# Patient Record
Sex: Female | Born: 1955 | Hispanic: No | Marital: Married | Smoking: Never smoker
Health system: Southern US, Community
[De-identification: ages and names within clinical notes are randomized; demographics above are authoritative.]

## PROBLEM LIST (undated history)

## (undated) DIAGNOSIS — C55 Malignant neoplasm of uterus, part unspecified: Secondary | ICD-10-CM

## (undated) DIAGNOSIS — Z8619 Personal history of other infectious and parasitic diseases: Secondary | ICD-10-CM

## (undated) DIAGNOSIS — N390 Urinary tract infection, site not specified: Secondary | ICD-10-CM

## (undated) DIAGNOSIS — E785 Hyperlipidemia, unspecified: Secondary | ICD-10-CM

## (undated) DIAGNOSIS — E7800 Pure hypercholesterolemia, unspecified: Secondary | ICD-10-CM

## (undated) HISTORY — DX: Urinary tract infection, site not specified: N39.0

## (undated) HISTORY — DX: Malignant neoplasm of uterus, part unspecified: C55

## (undated) HISTORY — PX: ABDOMINAL HYSTERECTOMY: SHX81

## (undated) HISTORY — DX: Personal history of other infectious and parasitic diseases: Z86.19

## (undated) HISTORY — DX: Pure hypercholesterolemia, unspecified: E78.00

## (undated) HISTORY — PX: APPENDECTOMY: SHX54

## (undated) HISTORY — DX: Hyperlipidemia, unspecified: E78.5

---

## 2016-08-17 ENCOUNTER — Telehealth: Payer: Self-pay | Admitting: Cardiology

## 2016-08-17 NOTE — Telephone Encounter (Signed)
08/17/2016 Received faxed referral packet from [facility] for upcoming with Dr. Stanford Breed on 09/02/2016. Records given to [facility]. cbr

## 2016-08-18 ENCOUNTER — Other Ambulatory Visit: Payer: Self-pay | Admitting: Physician Assistant

## 2016-08-18 DIAGNOSIS — R9431 Abnormal electrocardiogram [ECG] [EKG]: Secondary | ICD-10-CM

## 2016-08-25 NOTE — Progress Notes (Signed)
     HPI: 61 yo female for evaluation of abnormal ECG. Echocardiogram January 2018 showed normal LV function, grade 2 diastolic dysfunction and mild left atrial enlargement. Patient recently seen by primary care and felt to have an abnormal electrocardiogram. Cardiology asked to evaluate. She has dyspnea with vigorous activities but not routine activities. She denies orthopnea, PND, pedal edema, exertional chest pain or syncope. Occasional brief skip but no sustained palpitations.  Current Outpatient Prescriptions  Medication Sig Dispense Refill  . Multiple Vitamin (THERA) TABS Take 1 tablet by mouth daily.    . rosuvastatin (CRESTOR) 10 MG tablet Take 10 mg by mouth at bedtime.  0   No current facility-administered medications for this visit.     Not on File   Past Medical History:  Diagnosis Date  . Hyperlipidemia   . Uterine cancer [facility])     Past Surgical History:  Procedure Laterality Date  . ABDOMINAL HYSTERECTOMY    . APPENDECTOMY      Social History   Social History  . Marital status: Married    Spouse name: N/A  . Number of children: 2  . Years of education: N/A   Occupational History  . Not on file.   Social History Main Topics  . Smoking status: Never Smoker  . Smokeless tobacco: Never Used  . Alcohol use Yes     Comment: Rare  . Drug use: Unknown  . Sexual activity: Not on file   Other Topics Concern  . Not on file   Social History Narrative  . No narrative on file    Family History  Problem Relation Age of Onset  . Diabetes Mother   . Parkinson's disease Mother   . Kidney failure Father   . CAD Brother     ROS: no fevers or chills, productive cough, hemoptysis, dysphasia, odynophagia, melena, hematochezia, dysuria, hematuria, rash, seizure activity, orthopnea, PND, pedal edema, claudication. Remaining systems are negative.  Physical Exam:   Blood pressure 136/73, pulse 80, height 5\' 2"  (1.575 m), weight 214 lb 9.6 oz (97.3 kg).  General:   Well developed/obese in NAD Skin warm/dry Patient not depressed No peripheral clubbing Back-normal HEENT-normal/normal eyelids Neck supple/normal carotid upstroke bilaterally; no bruits; no JVD; no thyromegaly chest - CTA/ normal expansion CV - RRR/normal S1 and S2; no murmurs, rubs or gallops;  PMI nondisplaced Abdomen -NT/ND, no HSM, no mass, + bowel sounds, no bruit 2+ femoral pulses, no bruits Ext-no edema, chords, 2+ DP Neuro-grossly nonfocal  ECG -Sinus rhythm at a rate of 80. Nonspecific anterior T-wave changes.  A/P  1 Abnormal ECG-patient has nonspecific ST changes. She does have some dyspnea on exertion. Echocardiogram shows normal LV function. I will arrange an exercise treadmill for risk stratification given risk factors including cholesterol of 300, family history of coronary disease.   2 hyperlipidemia-continue statin. Lipids and liver monitored by primary care.  3 obesity-patient counseled on exercise and weight loss.     Kirk Ruths, MD

## 2016-08-27 ENCOUNTER — Other Ambulatory Visit: Payer: Self-pay | Admitting: Physician Assistant

## 2016-08-27 DIAGNOSIS — Z1231 Encounter for screening mammogram for malignant neoplasm of breast: Secondary | ICD-10-CM

## 2016-09-01 ENCOUNTER — Encounter (INDEPENDENT_AMBULATORY_CARE_PROVIDER_SITE_OTHER): Payer: Self-pay

## 2016-09-01 ENCOUNTER — Ambulatory Visit (HOSPITAL_COMMUNITY): Payer: BLUE CROSS/BLUE SHIELD | Attending: Cardiology

## 2016-09-01 ENCOUNTER — Other Ambulatory Visit: Payer: Self-pay

## 2016-09-01 DIAGNOSIS — R9431 Abnormal electrocardiogram [ECG] [EKG]: Secondary | ICD-10-CM

## 2016-09-01 DIAGNOSIS — I501 Left ventricular failure: Secondary | ICD-10-CM | POA: Insufficient documentation

## 2016-09-02 ENCOUNTER — Encounter: Payer: Self-pay | Admitting: Cardiology

## 2016-09-02 ENCOUNTER — Ambulatory Visit (INDEPENDENT_AMBULATORY_CARE_PROVIDER_SITE_OTHER): Payer: BLUE CROSS/BLUE SHIELD | Admitting: Cardiology

## 2016-09-02 VITALS — BP 136/73 | HR 80 | Ht 62.0 in | Wt 214.6 lb

## 2016-09-02 DIAGNOSIS — R9431 Abnormal electrocardiogram [ECG] [EKG]: Secondary | ICD-10-CM | POA: Diagnosis not present

## 2016-09-02 DIAGNOSIS — E7800 Pure hypercholesterolemia, unspecified: Secondary | ICD-10-CM | POA: Diagnosis not present

## 2016-09-02 NOTE — Patient Instructions (Signed)
Medication Instructions:   NO CHANGE  Testing/Procedures:  Your physician has requested that you have an exercise tolerance test. For further information please visit [email or web address]. Please also follow instruction sheet, as given.    Follow-Up:  Your physician recommends that you schedule a follow-up appointment in: AS NEEDED PENDING TEST RESULTS    Exercise Stress Electrocardiogram An exercise stress electrocardiogram is a test to check how blood flows to your heart. It is done to find areas of poor blood flow. You will need to walk on a treadmill for this test. The electrocardiogram will record your heartbeat when you are at rest and when you are exercising. What happens before the procedure?  Do not have drinks with caffeine or foods with caffeine for 24 hours before the test, or as told by your doctor. This includes coffee, tea (even decaf tea), sodas, chocolate, and cocoa.  Follow your doctor's instructions about eating and drinking before the test.  Ask your doctor what medicines you should or should not take before the test. Take your medicines with water unless told by your doctor not to.  If you use an inhaler, bring it with you to the test.  Bring a snack to eat after the test.  Do not  smoke for 4 hours before the test.  Do not put lotions, powders, creams, or oils on your chest before the test.  Wear comfortable shoes and clothing. What happens during the procedure?  You will have patches put on your chest. Small areas of your chest may need to be shaved. Wires will be connected to the patches.  Your heart rate will be watched while you are resting and while you are exercising.  You will walk on the treadmill. The treadmill will slowly get faster to raise your heart rate.  The test will take about 1-2 hours. What happens after the procedure?  Your heart rate and blood pressure will be watched after the test.  You may return to your normal diet,  activities, and medicines or as told by your doctor. This information is not intended to replace advice given to you by your health care provider. Make sure you discuss any questions you have with your health care provider. Document Released: 01/12/2008 Document Revised: 03/24/2016 Document Reviewed: 04/02/2013 Elsevier Interactive Patient Education  2017 Elsevier Inc.    

## 2016-09-04 ENCOUNTER — Encounter: Payer: Self-pay | Admitting: Cardiology

## 2016-09-17 ENCOUNTER — Telehealth (HOSPITAL_COMMUNITY): Payer: Self-pay

## 2016-09-17 NOTE — Telephone Encounter (Signed)
Encounter complete. 

## 2016-09-22 ENCOUNTER — Ambulatory Visit (HOSPITAL_COMMUNITY)
Admission: RE | Admit: 2016-09-22 | Discharge: 2016-09-22 | Disposition: A | Discharge status: Home / Self Care | Payer: BLUE CROSS/BLUE SHIELD | Source: Ambulatory Visit | Attending: Cardiology | Admitting: Cardiology

## 2016-09-22 DIAGNOSIS — R9431 Abnormal electrocardiogram [ECG] [EKG]: Secondary | ICD-10-CM | POA: Diagnosis not present

## 2016-09-22 LAB — EXERCISE TOLERANCE TEST
CHL RATE OF PERCEIVED EXERTION: 17
CSEPED: 7 min
CSEPEW: 9.3 METS
CSEPPHR: 144 {beats}/min
Exercise duration (sec): 32 s
MPHR: 159 {beats}/min
Percent HR: 90 %
Rest HR: 80 {beats}/min

## 2016-09-23 ENCOUNTER — Ambulatory Visit
Admission: RE | Admit: 2016-09-23 | Discharge: 2016-09-23 | Disposition: A | Discharge status: Home / Self Care | Payer: BLUE CROSS/BLUE SHIELD | Source: Ambulatory Visit | Attending: Physician Assistant | Admitting: Physician Assistant

## 2016-09-23 DIAGNOSIS — Z1231 Encounter for screening mammogram for malignant neoplasm of breast: Secondary | ICD-10-CM

## 2016-10-18 ENCOUNTER — Other Ambulatory Visit (HOSPITAL_COMMUNITY): Payer: Self-pay | Admitting: Physician Assistant

## 2016-10-18 DIAGNOSIS — R0989 Other specified symptoms and signs involving the circulatory and respiratory systems: Secondary | ICD-10-CM

## 2016-10-19 ENCOUNTER — Ambulatory Visit (HOSPITAL_COMMUNITY)
Admission: RE | Admit: 2016-10-19 | Discharge: 2016-10-19 | Disposition: A | Discharge status: Home / Self Care | Payer: BLUE CROSS/BLUE SHIELD | Source: Ambulatory Visit | Attending: Cardiovascular Disease | Admitting: Cardiovascular Disease

## 2016-10-19 DIAGNOSIS — R0989 Other specified symptoms and signs involving the circulatory and respiratory systems: Secondary | ICD-10-CM

## 2016-10-19 DIAGNOSIS — I6523 Occlusion and stenosis of bilateral carotid arteries: Secondary | ICD-10-CM | POA: Insufficient documentation

## 2016-11-06 ENCOUNTER — Encounter: Payer: Self-pay | Admitting: Cardiology

## 2016-11-16 ENCOUNTER — Encounter: Payer: Self-pay | Admitting: Cardiology

## 2016-11-17 ENCOUNTER — Encounter: Payer: Self-pay | Admitting: *Deleted

## 2018-02-13 DIAGNOSIS — L728 Other follicular cysts of the skin and subcutaneous tissue: Secondary | ICD-10-CM | POA: Diagnosis not present

## 2018-02-13 DIAGNOSIS — D2339 Other benign neoplasm of skin of other parts of face: Secondary | ICD-10-CM | POA: Diagnosis not present

## 2018-05-15 DIAGNOSIS — M8580 Other specified disorders of bone density and structure, unspecified site: Secondary | ICD-10-CM | POA: Diagnosis not present

## 2018-05-15 DIAGNOSIS — Z1211 Encounter for screening for malignant neoplasm of colon: Secondary | ICD-10-CM | POA: Diagnosis not present

## 2018-05-15 DIAGNOSIS — Z7689 Persons encountering health services in other specified circumstances: Secondary | ICD-10-CM | POA: Diagnosis not present

## 2018-05-15 DIAGNOSIS — Z23 Encounter for immunization: Secondary | ICD-10-CM | POA: Diagnosis not present

## 2018-05-15 DIAGNOSIS — Z0000 Encounter for general adult medical examination without abnormal findings: Secondary | ICD-10-CM | POA: Diagnosis not present

## 2018-05-19 DIAGNOSIS — M8589 Other specified disorders of bone density and structure, multiple sites: Secondary | ICD-10-CM | POA: Insufficient documentation

## 2018-05-19 DIAGNOSIS — E7800 Pure hypercholesterolemia, unspecified: Secondary | ICD-10-CM | POA: Insufficient documentation

## 2018-10-13 DIAGNOSIS — M8580 Other specified disorders of bone density and structure, unspecified site: Secondary | ICD-10-CM | POA: Diagnosis not present

## 2019-06-04 ENCOUNTER — Ambulatory Visit: Payer: BLUE CROSS/BLUE SHIELD | Admitting: Family Medicine

## 2019-07-18 NOTE — Progress Notes (Signed)
HPI: FU abnormal ECG.  Patient not seen since January 2018. Echocardiogram January 2018 showed normal LV function, grade 2 diastolic dysfunction and mild left atrial enlargement.  Exercise treadmill February 2018 negative. Carotid Dopplers March 2018 showed 1 to 39% bilateral stenosis.  Patient denies dyspnea on exertion, orthopnea, PND, pedal edema, syncope or exertional chest pain.  She has had problems with occasional palpitations.  These were worse when she was taking Crestor by her report.  She has been predominantly at night and described as her heart racing.  The gradually resolved.  Current Outpatient Medications  Medication Sig Dispense Refill  . aspirin 81 MG tablet Take 81 mg by mouth daily.    . Cholecalciferol (VITAMIN D PO) Take 1 tablet by mouth daily.    . Multiple Vitamin (THERA) TABS Take 1 tablet by mouth daily.    Marland Kitchen antiseptic oral rinse (BIOTENE) LIQD 15 mLs by Mouth Rinse route daily.    . rosuvastatin (CRESTOR) 10 MG tablet Take 10 mg by mouth at bedtime.  0   No current facility-administered medications for this visit.     Past Medical History:  Diagnosis Date  . Hyperlipidemia   . Uterine cancer [facility])     Past Surgical History:  Procedure Laterality Date  . ABDOMINAL HYSTERECTOMY    . APPENDECTOMY      Social History   Socioeconomic History  . Marital status: Married    Spouse name: Not on file  . Number of children: 2  . Years of education: Not on file  . Highest education level: Not on file  Occupational History  . Not on file  Tobacco Use  . Smoking status: Never Smoker  . Smokeless tobacco: Never Used  Substance and Sexual Activity  . Alcohol use: Yes    Comment: Rare  . Drug use: Not on file  . Sexual activity: Not on file  Other Topics Concern  . Not on file  Social History Narrative  . Not on file   Social Determinants of Health   Financial Resource Strain:   . Difficulty of Paying Living Expenses: Not on file  Food  Insecurity:   . Worried About Charity fundraiser in the Last Year: Not on file  . Ran Out of Food in the Last Year: Not on file  Transportation Needs:   . Lack of Transportation (Medical): Not on file  . Lack of Transportation (Non-Medical): Not on file  Physical Activity:   . Days of Exercise per Week: Not on file  . Minutes of Exercise per Session: Not on file  Stress:   . Feeling of Stress : Not on file  Social Connections:   . Frequency of Communication with Friends and Family: Not on file  . Frequency of Social Gatherings with Friends and Family: Not on file  . Attends Religious Services: Not on file  . Active Member of Clubs or Organizations: Not on file  . Attends Archivist Meetings: Not on file  . Marital Status: Not on file  Intimate Partner Violence:   . Fear of Current or Ex-Partner: Not on file  . Emotionally Abused: Not on file  . Physically Abused: Not on file  . Sexually Abused: Not on file    Family History  Problem Relation Age of Onset  . Diabetes Mother   . Parkinson's disease Mother   . Kidney failure Father   . CAD Brother     ROS: no fevers or  chills, productive cough, hemoptysis, dysphasia, odynophagia, melena, hematochezia, dysuria, hematuria, rash, seizure activity, orthopnea, PND, pedal edema, claudication. Remaining systems are negative.  Physical Exam: Well-developed well-nourished in no acute distress.  Skin is warm and dry.  HEENT is normal.  Neck is supple.  Chest is clear to auscultation with normal expansion.  Cardiovascular exam is regular rate and rhythm.  Abdominal exam nontender or distended. No masses palpated. Extremities show no edema. neuro grossly intact  ECG-sinus rhythm at a rate of 79, no ST changes.  Personally reviewed  A/P  1 abnormal electrocardiogram-previous electrocardiogram showed nonspecific changes.  Echocardiogram showed normal LV function and exercise treadmill was normal.  No symptoms at present.   No plans for further evaluation.  2 hyperlipidemia-she discontinued her statin as it was potentially contributing to palpitations by her report.  She is scheduled to see primary care later this month and have blood work done.  She may need resumption of statin pending those results.  3 obesity-needs diet, exercise and weight loss.  4 palpitations-etiology unclear.  We have given the patient the name of alivecor.  She will record strips of her rhythm when she has palpitations and we will review.  5 elevated blood pressure reading-she does not carry a diagnosis of hypertension.  I have asked her to track this at home and we will add medications if needed.  Kirk Ruths, MD

## 2019-07-23 ENCOUNTER — Encounter: Payer: Self-pay | Admitting: Cardiology

## 2019-07-23 ENCOUNTER — Ambulatory Visit (INDEPENDENT_AMBULATORY_CARE_PROVIDER_SITE_OTHER): Payer: BLUE CROSS/BLUE SHIELD | Admitting: Cardiology

## 2019-07-23 ENCOUNTER — Other Ambulatory Visit: Payer: Self-pay

## 2019-07-23 VITALS — BP 148/91 | HR 78 | Temp 96.0°F | Ht 61.5 in | Wt 220.0 lb

## 2019-07-23 DIAGNOSIS — R9431 Abnormal electrocardiogram [ECG] [EKG]: Secondary | ICD-10-CM | POA: Diagnosis not present

## 2019-07-23 DIAGNOSIS — R002 Palpitations: Secondary | ICD-10-CM

## 2019-07-23 DIAGNOSIS — R030 Elevated blood-pressure reading, without diagnosis of hypertension: Secondary | ICD-10-CM | POA: Diagnosis not present

## 2019-07-23 NOTE — Patient Instructions (Signed)

## 2019-07-31 ENCOUNTER — Other Ambulatory Visit: Payer: Self-pay

## 2019-08-01 ENCOUNTER — Ambulatory Visit (INDEPENDENT_AMBULATORY_CARE_PROVIDER_SITE_OTHER): Payer: BLUE CROSS/BLUE SHIELD | Admitting: Family Medicine

## 2019-08-01 ENCOUNTER — Encounter: Payer: Self-pay | Admitting: Family Medicine

## 2019-08-01 VITALS — BP 116/82 | HR 84 | Temp 97.4°F | Ht 61.5 in | Wt 218.0 lb

## 2019-08-01 DIAGNOSIS — C55 Malignant neoplasm of uterus, part unspecified: Secondary | ICD-10-CM | POA: Insufficient documentation

## 2019-08-01 DIAGNOSIS — Z0000 Encounter for general adult medical examination without abnormal findings: Secondary | ICD-10-CM | POA: Diagnosis not present

## 2019-08-01 DIAGNOSIS — Z1159 Encounter for screening for other viral diseases: Secondary | ICD-10-CM

## 2019-08-01 DIAGNOSIS — E7800 Pure hypercholesterolemia, unspecified: Secondary | ICD-10-CM | POA: Diagnosis not present

## 2019-08-01 DIAGNOSIS — M8589 Other specified disorders of bone density and structure, multiple sites: Secondary | ICD-10-CM | POA: Diagnosis not present

## 2019-08-01 DIAGNOSIS — Z1211 Encounter for screening for malignant neoplasm of colon: Secondary | ICD-10-CM

## 2019-08-01 DIAGNOSIS — Z114 Encounter for screening for human immunodeficiency virus [HIV]: Secondary | ICD-10-CM

## 2019-08-01 NOTE — Progress Notes (Signed)
Patient: Suzanne Kelley MRN: [id number] DOB: 05-02-1956 PCP: Orma Flaming, MD     Subjective:  Chief Complaint  Patient presents with  . Establish Care  . Annual Exam    HPI: The patient is a 63 y.o. female who presents today for annual exam. She denies any changes to past medical history. There have been no recent hospitalizations. They are following a well balanced diet and exercise plan. Weight has been stable. No complaints today. She is fasting. Also recently seen by dermatology for skin exam with no abnormal findings.   No family history of colon cancer or breast cancer. Dad died from renal failure in his 7's. Her mother was in a NH. She didn't take care of herself. Unsure of history of CAD/DM/HTN. ? PD in her mother.   Hyperlipidemia: she has seen a cardiologist in the past and was on crestor. She had some palpitations that the cardiologist worked up and said was "fine." She has not filled the crestor again.   Osteopenia: diagnosed 2 years. On calcium and vitamin D. Will need repeat of her DEXA in 3-5 years.   Immunization History  Administered Date(s) Administered  . Influenza-Unspecified 05/15/2018  . Tdap 10/11/2016   Colonoscopy: due for this.  Mammogram: 2018, wnl.  Pap smear: s/p hysterectomy for uterine cancer. No longer needs pap smears.  Flu shot: declines.    Review of Systems  Constitutional: Negative for chills, fatigue and fever.  HENT: Negative for dental problem, ear pain, hearing loss and trouble swallowing.   Eyes: Negative for visual disturbance.  Respiratory: Negative for cough, chest tightness and shortness of breath.   Cardiovascular: Negative for chest pain, palpitations and leg swelling.  Gastrointestinal: Negative for abdominal pain, blood in stool, diarrhea and nausea.  Endocrine: Negative for cold intolerance, heat intolerance, polydipsia, polyphagia and polyuria.  Genitourinary: Negative for dysuria and hematuria.  Musculoskeletal:  Negative for arthralgias.  Skin: Negative for rash.  Neurological: Negative for dizziness and headaches.  Psychiatric/Behavioral: Positive for sleep disturbance. Negative for dysphoric mood. The patient is not nervous/anxious.     Allergies Patient has No Known Allergies.  Past Medical History Patient  has a past medical history of Hyperlipidemia and Uterine cancer ([place]).  Surgical History Patient  has a past surgical history that includes Abdominal hysterectomy and Appendectomy.  Family History Pateint's family history includes CAD in her brother; Diabetes in her mother; Kidney failure in her father; Parkinson's disease in her mother.  Social History Patient  reports that she has never smoked. She has never used smokeless tobacco. She reports current alcohol use.    Objective: Vitals:   08/01/19 0828  BP: 116/82  Pulse: 84  Temp: (!) 97.4 F (36.3 C)  TempSrc: Skin  SpO2: 98%  Weight: 218 lb (98.9 kg)  Height: 5' 1.5" (1.562 m)    Body mass index is 40.52 kg/m.  Physical Exam Vitals reviewed.  Constitutional:      Appearance: Normal appearance. She is well-developed. She is obese.  HENT:     Head: Normocephalic and atraumatic.     Right Ear: Tympanic membrane, ear canal and external ear normal.     Left Ear: Tympanic membrane, ear canal and external ear normal.     Nose: Nose normal.     Mouth/Throat:     Mouth: Mucous membranes are moist.  Eyes:     Extraocular Movements: Extraocular movements intact.     Conjunctiva/sclera: Conjunctivae normal.     Pupils: Pupils are equal, round,  and reactive to light.  Neck:     Thyroid: No thyromegaly.  Cardiovascular:     Rate and Rhythm: Normal rate and regular rhythm.     Pulses: Normal pulses.     Heart sounds: Normal heart sounds. No murmur.  Pulmonary:     Effort: Pulmonary effort is normal.     Breath sounds: Normal breath sounds.  Abdominal:     General: Bowel sounds are normal. There is no distension.      Palpations: Abdomen is soft.     Tenderness: There is no abdominal tenderness.  Musculoskeletal:     Cervical back: Normal range of motion and neck supple.  Lymphadenopathy:     Cervical: No cervical adenopathy.  Skin:    General: Skin is warm and dry.     Findings: No rash.  Neurological:     General: No focal deficit present.     Mental Status: She is alert and oriented to person, place, and time.     Cranial Nerves: No cranial nerve deficit.     Coordination: Coordination normal.     Deep Tendon Reflexes: Reflexes normal.  Psychiatric:        Mood and Affect: Mood normal.        Behavior: Behavior normal.        Depression screen [facility] 2/9 08/01/2019  Decreased Interest 0  Down, Depressed, Hopeless 0  PHQ - 2 Score 0    Assessment/plan: 1. Annual physical exam Routine fasting labs today. addressed all of her HM. Declines flu shot, encouraged her to get this. cologuard ordered, information on mmg given. Hepc/hiv routine screening labs ordered. utd on tetanus. Encouraged exercise and weight loss. Followed by derm for skin checks.  F/u in one year or as needed.  Patient counseling [x]    Nutrition: Stressed importance of moderation in sodium/caffeine intake, saturated fat and cholesterol, caloric balance, sufficient intake of fresh fruits, vegetables, fiber, calcium, iron, and 1 mg of folate supplement per day (for females capable of pregnancy).  [x]    Stressed the importance of regular exercise.   []    Substance Abuse: Discussed cessation/primary prevention of tobacco, alcohol, or other drug use; driving or other dangerous activities under the influence; availability of treatment for abuse.   [x]    Injury prevention: Discussed safety belts, safety helmets, smoke detector, smoking near bedding or upholstery.   [x]    Sexuality: Discussed sexually transmitted diseases, partner selection, use of condoms, avoidance of unintended pregnancy  and contraceptive alternatives.  [x]    Dental  health: Discussed importance of regular tooth brushing, flossing, and dental visits.  [x]    Health maintenance and immunizations reviewed. Please refer to Health maintenance section.    - CBC with Differential/Platelet - Comprehensive metabolic panel - TSH  2. Hypercholesterolemia Will likely start her back on crestor. She is onboard with this. Will get labs back first.  - Lipid panel  3. Osteopenia of multiple sites  - VITAMIN D 25 Hydroxy (Vit-D Deficiency, Fractures)  4. Encounter for hepatitis C screening test for low risk patient  - Hepatitis C antibody  5. Encounter for screening for HIV  - HIV antibody  6. Screening for colon cancer  - Cologuard    This visit occurred during the SARS-CoV-2 public health emergency.  Safety protocols were in place, including screening questions prior to the visit, additional usage of staff PPE, and extensive cleaning of exam room while observing appropriate contact time as indicated for disinfecting solutions.     Return  in about 1 year (around 07/31/2020).     Orma Flaming, MD [facility]  08/01/2019

## 2019-08-01 NOTE — Patient Instructions (Signed)
So nice to meet you! Merry christmas! Dr. Rogers Blocker    Preventive Care 58-64 Years Old, Female Preventive care refers to visits with your health care provider and lifestyle choices that can promote health and wellness. This includes:  A yearly physical exam. This may also be called an annual well check.  Regular dental visits and eye exams.  Immunizations.  Screening for certain conditions.  Healthy lifestyle choices, such as eating a healthy diet, getting regular exercise, not using drugs or products that contain nicotine and tobacco, and limiting alcohol use. What can I expect for my preventive care visit? Physical exam Your health care provider will check your:  Height and weight. This may be used to calculate body mass index (BMI), which tells if you are at a healthy weight.  Heart rate and blood pressure.  Skin for abnormal spots. Counseling Your health care provider may ask you questions about your:  Alcohol, tobacco, and drug use.  Emotional well-being.  Home and relationship well-being.  Sexual activity.  Eating habits.  Work and work Statistician.  Method of birth control.  Menstrual cycle.  Pregnancy history. What immunizations do I need?  Influenza (flu) vaccine  This is recommended every year. Tetanus, diphtheria, and pertussis (Tdap) vaccine  You may need a Td booster every 10 years. Varicella (chickenpox) vaccine  You may need this if you have not been vaccinated. Zoster (shingles) vaccine  You may need this after age 61. Measles, mumps, and rubella (MMR) vaccine  You may need at least one dose of MMR if you were born in 1957 or later. You may also need a second dose. Pneumococcal conjugate (PCV13) vaccine  You may need this if you have certain conditions and were not previously vaccinated. Pneumococcal polysaccharide (PPSV23) vaccine  You may need one or two doses if you smoke cigarettes or if you have certain conditions. Meningococcal  conjugate (MenACWY) vaccine  You may need this if you have certain conditions. Hepatitis A vaccine  You may need this if you have certain conditions or if you travel or work in places where you may be exposed to hepatitis A. Hepatitis B vaccine  You may need this if you have certain conditions or if you travel or work in places where you may be exposed to hepatitis B. Haemophilus influenzae type b (Hib) vaccine  You may need this if you have certain conditions. Human papillomavirus (HPV) vaccine  If recommended by your health care provider, you may need three doses over 6 months. You may receive vaccines as individual doses or as more than one vaccine together in one shot (combination vaccines). Talk with your health care provider about the risks and benefits of combination vaccines. What tests do I need? Blood tests  Lipid and cholesterol levels. These may be checked every 5 years, or more frequently if you are over 45 years old.  Hepatitis C test.  Hepatitis B test. Screening  Lung cancer screening. You may have this screening every year starting at age 86 if you have a 30-pack-year history of smoking and currently smoke or have quit within the past 15 years.  Colorectal cancer screening. All adults should have this screening starting at age 97 and continuing until age 60. Your health care provider may recommend screening at age 52 if you are at increased risk. You will have tests every 1-10 years, depending on your results and the type of screening test.  Diabetes screening. This is done by checking your blood sugar (glucose) after  you have not eaten for a while (fasting). You may have this done every 1-3 years.  Mammogram. This may be done every 1-2 years. Talk with your health care provider about when you should start having regular mammograms. This may depend on whether you have a family history of breast cancer.  BRCA-related cancer screening. This may be done if you have a  family history of breast, ovarian, tubal, or peritoneal cancers.  Pelvic exam and Pap test. This may be done every 3 years starting at age 67. Starting at age 71, this may be done every 5 years if you have a Pap test in combination with an HPV test. Other tests  Sexually transmitted disease (STD) testing.  Bone density scan. This is done to screen for osteoporosis. You may have this scan if you are at high risk for osteoporosis. Follow these instructions at home: Eating and drinking  Eat a diet that includes fresh fruits and vegetables, whole grains, lean protein, and low-fat dairy.  Take vitamin and mineral supplements as recommended by your health care provider.  Do not drink alcohol if: ? Your health care provider tells you not to drink. ? You are pregnant, may be pregnant, or are planning to become pregnant.  If you drink alcohol: ? Limit how much you have to 0-1 drink a day. ? Be aware of how much alcohol is in your drink. In the [place] one drink equals one 12 oz bottle of beer (355 mL), one 5 oz glass of wine (148 mL), or one 1 oz glass of hard liquor (44 mL). Lifestyle  Take daily care of your teeth and gums.  Stay active. Exercise for at least 30 minutes on 5 or more days each week.  Do not use any products that contain nicotine or tobacco, such as cigarettes, e-cigarettes, and chewing tobacco. If you need help quitting, ask your health care provider.  If you are sexually active, practice safe sex. Use a condom or other form of birth control (contraception) in order to prevent pregnancy and STIs (sexually transmitted infections).  If told by your health care provider, take low-dose aspirin daily starting at age 29. What's next?  Visit your health care provider once a year for a well check visit.  Ask your health care provider how often you should have your eyes and teeth checked.  Stay up to date on all vaccines. This information is not intended to replace advice given  to you by your health care provider. Make sure you discuss any questions you have with your health care provider. Document Released: 08/22/2015 Document Revised: 04/06/2018 Document Reviewed: 04/06/2018 Elsevier Patient Education  2020 [employer].

## 2019-08-06 ENCOUNTER — Encounter: Payer: Self-pay | Admitting: Family Medicine

## 2019-08-06 ENCOUNTER — Other Ambulatory Visit (INDEPENDENT_AMBULATORY_CARE_PROVIDER_SITE_OTHER): Payer: BLUE CROSS/BLUE SHIELD

## 2019-08-06 DIAGNOSIS — M8589 Other specified disorders of bone density and structure, multiple sites: Secondary | ICD-10-CM | POA: Diagnosis not present

## 2019-08-06 DIAGNOSIS — Z1159 Encounter for screening for other viral diseases: Secondary | ICD-10-CM

## 2019-08-06 DIAGNOSIS — Z0000 Encounter for general adult medical examination without abnormal findings: Secondary | ICD-10-CM

## 2019-08-06 DIAGNOSIS — E7800 Pure hypercholesterolemia, unspecified: Secondary | ICD-10-CM

## 2019-08-06 DIAGNOSIS — Z114 Encounter for screening for human immunodeficiency virus [HIV]: Secondary | ICD-10-CM

## 2019-08-06 LAB — CBC WITH DIFFERENTIAL/PLATELET
Basophils Absolute: 0 10*3/uL (ref 0.0–0.1)
Basophils Relative: 0.5 % (ref 0.0–3.0)
Eosinophils Absolute: 0.1 10*3/uL (ref 0.0–0.7)
Eosinophils Relative: 0.9 % (ref 0.0–5.0)
HCT: 42.1 % (ref 36.0–46.0)
Hemoglobin: 14.1 g/dL (ref 12.0–15.0)
Lymphocytes Relative: 32.7 % (ref 12.0–46.0)
Lymphs Abs: 2 10*3/uL (ref 0.7–4.0)
MCHC: 33.5 g/dL (ref 30.0–36.0)
MCV: 95.5 fl (ref 78.0–100.0)
Monocytes Absolute: 0.4 10*3/uL (ref 0.1–1.0)
Monocytes Relative: 7 % (ref 3.0–12.0)
Neutro Abs: 3.6 10*3/uL (ref 1.4–7.7)
Neutrophils Relative %: 58.9 % (ref 43.0–77.0)
Platelets: 191 10*3/uL (ref 150.0–400.0)
RBC: 4.41 Mil/uL (ref 3.87–5.11)
RDW: 13.4 % (ref 11.5–15.5)
WBC: 6.2 10*3/uL (ref 4.0–10.5)

## 2019-08-06 LAB — COMPREHENSIVE METABOLIC PANEL
ALT: 16 U/L (ref 0–35)
AST: 24 U/L (ref 0–37)
Albumin: 4.4 g/dL (ref 3.5–5.2)
Alkaline Phosphatase: 59 U/L (ref 39–117)
BUN: 15 mg/dL (ref 6–23)
CO2: 27 mEq/L (ref 19–32)
Calcium: 9.6 mg/dL (ref 8.4–10.5)
Chloride: 101 mEq/L (ref 96–112)
Creatinine, Ser: 0.77 mg/dL (ref 0.40–1.20)
GFR: 75.43 mL/min (ref >60.00)
Glucose, Bld: 92 mg/dL (ref 70–99)
Potassium: 5.1 mEq/L (ref 3.5–5.1)
Sodium: 139 mEq/L (ref 135–145)
Total Bilirubin: 0.8 mg/dL (ref 0.2–1.2)
Total Protein: 7 g/dL (ref 6.0–8.3)

## 2019-08-06 LAB — TSH: TSH: 2.32 u[IU]/mL (ref 0.35–4.50)

## 2019-08-06 LAB — LIPID PANEL
Cholesterol: 290 mg/dL — ABNORMAL HIGH (ref 0–200)
HDL: 49.2 mg/dL (ref >39.00)
NonHDL: 241.21
Total CHOL/HDL Ratio: 6
Triglycerides: 357 mg/dL — ABNORMAL HIGH (ref 0.0–149.0)
VLDL: 71.4 mg/dL — ABNORMAL HIGH (ref 0.0–40.0)

## 2019-08-06 LAB — VITAMIN D 25 HYDROXY (VIT D DEFICIENCY, FRACTURES): VITD: 28.09 ng/mL — ABNORMAL LOW (ref 30.00–100.00)

## 2019-08-06 LAB — LDL CHOLESTEROL, DIRECT: Direct LDL: 174 mg/dL

## 2019-08-07 LAB — HEPATITIS C ANTIBODY
Hepatitis C Ab: NONREACTIVE
SIGNAL TO CUT-OFF: 0.01 (ref <1.00)

## 2019-08-07 LAB — HIV ANTIBODY (ROUTINE TESTING W REFLEX): HIV 1&2 Ab, 4th Generation: NONREACTIVE

## 2019-08-08 ENCOUNTER — Other Ambulatory Visit: Payer: Self-pay | Admitting: Family Medicine

## 2019-08-08 ENCOUNTER — Encounter: Payer: Self-pay | Admitting: Family Medicine

## 2019-08-08 MED ORDER — ROSUVASTATIN CALCIUM 10 MG PO TABS: 10.0000 mg | ORAL_TABLET | Freq: Every day | ORAL | 3 refills | Status: DC

## 2019-08-13 LAB — COLOGUARD: Cologuard: POSITIVE — AB

## 2019-08-19 LAB — COLOGUARD

## 2019-08-20 ENCOUNTER — Telehealth: Payer: Self-pay | Admitting: Family Medicine

## 2019-08-20 DIAGNOSIS — R195 Other fecal abnormalities: Secondary | ICD-10-CM | POA: Insufficient documentation

## 2019-08-20 NOTE — Telephone Encounter (Signed)
Please let her know that her cologaurd test came back positive. She will need a colonoscopy to rule out colon cancer, precancer, etc. I have put this order in for her. Thank you for doing this. Let us know if any questions.   Dr. Rogers Blocker

## 2019-08-21 ENCOUNTER — Encounter: Payer: Self-pay | Admitting: Family Medicine

## 2019-08-21 NOTE — Telephone Encounter (Signed)
Spoke with patient and informed of positive cologuard.  Patient verbalized understanding and states that GI has already contacted her to schedule her for colonoscopy.  She reports that she has an appointment scheduled next week.

## 2019-08-28 ENCOUNTER — Telehealth: Payer: Self-pay

## 2019-08-28 NOTE — Telephone Encounter (Signed)
Heather from [facility] is requesting fax for Cologuard test results. Patient is schedule to come into office 08/29/19

## 2019-08-29 ENCOUNTER — Ambulatory Visit (INDEPENDENT_AMBULATORY_CARE_PROVIDER_SITE_OTHER): Payer: BLUE CROSS/BLUE SHIELD | Admitting: Physician Assistant

## 2019-08-29 ENCOUNTER — Encounter: Payer: Self-pay | Admitting: Physician Assistant

## 2019-08-29 VITALS — BP 134/72 | HR 84 | Temp 97.8°F | Ht 61.5 in | Wt 219.0 lb

## 2019-08-29 DIAGNOSIS — Z01818 Encounter for other preprocedural examination: Secondary | ICD-10-CM

## 2019-08-29 DIAGNOSIS — R195 Other fecal abnormalities: Secondary | ICD-10-CM | POA: Diagnosis not present

## 2019-08-29 MED ORDER — NA SULFATE-K SULFATE-MG SULF 17.5-3.13-1.6 GM/177ML PO SOLN: 1.0000 | Freq: Once | ORAL | 0 refills | Status: AC

## 2019-08-29 NOTE — Patient Instructions (Addendum)
If you are age 65 or older, your body mass index should be between 23-30. Your Body mass index is 40.71 kg/m. If this is out of the aforementioned range listed, please consider follow up with your Primary Care Provider.  If you are age 66 or younger, your body mass index should be between 19-25. Your Body mass index is 40.71 kg/m. If this is out of the aformentioned range listed, please consider follow up with your Primary Care Provider.   You have been scheduled for a colonoscopy. Please follow written instructions given to you at your visit today.  Please pick up your prep supplies at the pharmacy within the next 1-3 days. If you use inhalers (even only as needed), please bring them with you on the day of your procedure.

## 2019-08-29 NOTE — Telephone Encounter (Signed)
Please fax these today.  Thanks! Orma Flaming, MD [place]

## 2019-08-29 NOTE — Progress Notes (Signed)
Chief Complaint: Positive Cologuard  HPI:    Suzanne Kelley is a 64 year old Caucasian female with a past medical history as listed below, who was referred to me by Orma Flaming, MD for a complaint of positive Cologuard.      Today, the patient presents clinic and explains that she is doing well GI wise but has always tried to avoid a colonoscopy because she has heard from a couple of her friends that the prep was terrible and that they vomited and another friend who said that their insurance did not cover it fully.  For this reason she has been avoiding this.  She tells me she recently had a positive Cologuard about a week ago.    Denies fever, chills, abdominal pain, change in bowel habits or weight loss.  Past Medical History:  Diagnosis Date  . High cholesterol   . History of chicken pox   . Hyperlipidemia   . Uterine cancer ([place])   . UTI (urinary tract infection)     Past Surgical History:  Procedure Laterality Date  . ABDOMINAL HYSTERECTOMY    . APPENDECTOMY      Current Outpatient Medications  Medication Sig Dispense Refill  . aspirin 81 MG tablet Take 81 mg by mouth daily.    . Cholecalciferol (VITAMIN D PO) Take 1 tablet by mouth daily.    . Multiple Vitamin (THERA) TABS Take 1 tablet by mouth daily.    . rosuvastatin (CRESTOR) 10 MG tablet Take 1 tablet (10 mg total) by mouth daily. 90 tablet 3   No current facility-administered medications for this visit.    Allergies as of 08/29/2019  . (No Known Allergies)    Family History  Problem Relation Age of Onset  . Diabetes Mother   . Parkinson's disease Mother   . COPD Mother   . Kidney failure Father   . CAD Brother   . Early death Brother   . Heart disease Brother     Social History   Socioeconomic History  . Marital status: Married    Spouse name: Not on file  . Number of children: 2  . Years of education: Not on file  . Highest education level: Not on file  Occupational History  . Not on file    Tobacco Use  . Smoking status: Never Smoker  . Smokeless tobacco: Never Used  Substance and Sexual Activity  . Alcohol use: Yes    Comment: Rare  . Drug use: Never  . Sexual activity: Not on file  Other Topics Concern  . Not on file  Social History Narrative  . Not on file   Social Determinants of Health   Financial Resource Strain:   . Difficulty of Paying Living Expenses: Not on file  Food Insecurity:   . Worried About Charity fundraiser in the Last Year: Not on file  . Ran Out of Food in the Last Year: Not on file  Transportation Needs:   . Lack of Transportation (Medical): Not on file  . Lack of Transportation (Non-Medical): Not on file  Physical Activity:   . Days of Exercise per Week: Not on file  . Minutes of Exercise per Session: Not on file  Stress:   . Feeling of Stress : Not on file  Social Connections:   . Frequency of Communication with Friends and Family: Not on file  . Frequency of Social Gatherings with Friends and Family: Not on file  . Attends Religious Services: Not on file  .  Active Member of Clubs or Organizations: Not on file  . Attends Archivist Meetings: Not on file  . Marital Status: Not on file  Intimate Partner Violence:   . Fear of Current or Ex-Partner: Not on file  . Emotionally Abused: Not on file  . Physically Abused: Not on file  . Sexually Abused: Not on file    Review of Systems:    Constitutional: No weight loss, fever or chills Skin: No rash  Cardiovascular: No chest pain Respiratory: No SOB  Gastrointestinal: See HPI and otherwise negative Genitourinary: No dysuria  Neurological: No headache Musculoskeletal: No new muscle or joint pain Hematologic: No bleeding  Psychiatric: No history of depression or anxiety   Physical Exam:  Vital signs: BP 134/72   Pulse 84   Temp 97.8 F (36.6 C)   Ht 5' 1.5" (1.562 m)   Wt 219 lb (99.3 kg)   BMI 40.71 kg/m   Constitutional:   Pleasant Caucasian female appears to  be in NAD, Well developed, Well nourished, alert and cooperative Head:  Normocephalic and atraumatic. Eyes:   PEERL, EOMI. No icterus. Conjunctiva pink. Ears:  Normal auditory acuity. Neck:  Supple Throat: Oral cavity and pharynx without inflammation, swelling or lesion.  Respiratory: Respirations even and unlabored. Lungs clear to auscultation bilaterally.   No wheezes, crackles, or rhonchi.  Cardiovascular: Normal S1, S2. No MRG. Regular rate and rhythm. No peripheral edema, cyanosis or pallor.  Gastrointestinal:  Soft, nondistended, nontender. No rebound or guarding. Normal bowel sounds. No appreciable masses or hepatomegaly. Rectal:  Not performed.  Msk:  Symmetrical without gross deformities. Without edema, no deformity or joint abnormality.  Neurologic:  Alert and  oriented x4;  grossly normal neurologically.  Skin:   Dry and intact without significant lesions or rashes. Psychiatric:  Demonstrates good judgement and reason without abnormal affect or behaviors.  MOST RECENT LABS AND IMAGING: CBC    Component Value Date/Time   WBC 6.2 08/06/2019 0859   RBC 4.41 08/06/2019 0859   HGB 14.1 08/06/2019 0859   HCT 42.1 08/06/2019 0859   PLT 191.0 08/06/2019 0859   MCV 95.5 08/06/2019 0859   MCHC 33.5 08/06/2019 0859   RDW 13.4 08/06/2019 0859   LYMPHSABS 2.0 08/06/2019 0859   MONOABS 0.4 08/06/2019 0859   EOSABS 0.1 08/06/2019 0859   BASOSABS 0.0 08/06/2019 0859    CMP     Component Value Date/Time   NA 139 08/06/2019 0859   K 5.1 08/06/2019 0859   CL 101 08/06/2019 0859   CO2 27 08/06/2019 0859   GLUCOSE 92 08/06/2019 0859   BUN 15 08/06/2019 0859   CREATININE 0.77 08/06/2019 0859   CALCIUM 9.6 08/06/2019 0859   PROT 7.0 08/06/2019 0859   ALBUMIN 4.4 08/06/2019 0859   AST 24 08/06/2019 0859   ALT 16 08/06/2019 0859   ALKPHOS 59 08/06/2019 0859   BILITOT 0.8 08/06/2019 0859    Assessment: 1.  Positive Cologuard: Positive Cologuard about a week ago, no GI  symptoms  Plan: 1.  We will proceed with a colonoscopy for positive Cologuard.  Schedule with Dr. Ardis Hughs in the [facility].  Did discuss risks, benefits, limitations and alternatives and patient agrees to proceed. 2.  Reviewed with the patient what a positive Cologuard means.  Answered all of her questions. 3.  Patient to follow in clinic per recommendations from Dr. Ardis Hughs after time of procedure.  Ellouise Newer, PA-C [facility] 08/29/2019, 3:54 PM  Cc: Orma Flaming, MD

## 2019-08-30 ENCOUNTER — Encounter: Payer: Self-pay | Admitting: Family Medicine

## 2019-08-30 NOTE — Progress Notes (Signed)
I agree with the above note, plan 

## 2019-08-31 NOTE — Telephone Encounter (Signed)
Per JoEllen faxed 08/29/2019.

## 2019-09-03 NOTE — Telephone Encounter (Signed)
Spoke with patient and informed her sample would be up front for her to pick up when she came to [facility] for her COVID testing.

## 2019-09-17 ENCOUNTER — Other Ambulatory Visit: Payer: Self-pay

## 2019-09-17 ENCOUNTER — Ambulatory Visit (INDEPENDENT_AMBULATORY_CARE_PROVIDER_SITE_OTHER): Payer: BLUE CROSS/BLUE SHIELD

## 2019-09-17 ENCOUNTER — Other Ambulatory Visit: Payer: Self-pay | Admitting: Gastroenterology

## 2019-09-17 DIAGNOSIS — Z1159 Encounter for screening for other viral diseases: Secondary | ICD-10-CM | POA: Diagnosis not present

## 2019-09-18 ENCOUNTER — Encounter: Payer: Self-pay | Admitting: Gastroenterology

## 2019-09-18 LAB — SARS CORONAVIRUS 2 (TAT 6-24 HRS): SARS Coronavirus 2: NEGATIVE

## 2019-09-19 ENCOUNTER — Ambulatory Visit (AMBULATORY_SURGERY_CENTER): Payer: BLUE CROSS/BLUE SHIELD | Admitting: Gastroenterology

## 2019-09-19 ENCOUNTER — Encounter: Payer: Self-pay | Admitting: Gastroenterology

## 2019-09-19 ENCOUNTER — Other Ambulatory Visit: Payer: Self-pay

## 2019-09-19 VITALS — BP 115/72 | HR 76 | Temp 95.5°F | Resp 13 | Ht 61.0 in | Wt 219.0 lb

## 2019-09-19 DIAGNOSIS — R195 Other fecal abnormalities: Secondary | ICD-10-CM

## 2019-09-19 DIAGNOSIS — K5730 Diverticulosis of large intestine without perforation or abscess without bleeding: Secondary | ICD-10-CM | POA: Diagnosis not present

## 2019-09-19 DIAGNOSIS — D123 Benign neoplasm of transverse colon: Secondary | ICD-10-CM | POA: Diagnosis not present

## 2019-09-19 DIAGNOSIS — D124 Benign neoplasm of descending colon: Secondary | ICD-10-CM | POA: Diagnosis not present

## 2019-09-19 MED ORDER — SODIUM CHLORIDE 0.9 % IV SOLN: 500.0000 mL | Freq: Once | INTRAVENOUS | Status: DC

## 2019-09-19 NOTE — Progress Notes (Signed)
Called to room to assist during endoscopic procedure.  Patient ID and intended procedure confirmed with present staff. Received instructions for my participation in the procedure from the performing physician.  

## 2019-09-19 NOTE — Op Note (Signed)
[facility] Patient Name: Suzanne Kelley Procedure Date: 09/19/2019 10:05 AM MRN: [id number] Endoscopist: Milus Banister , MD Age: 64 Referring MD:  Date of Birth: 1956-03-14 Gender: Female Account #: [id number] Procedure:                Colonoscopy Indications:              Positive Cologuard test Medicines:                Monitored Anesthesia Care Procedure:                Pre-Anesthesia Assessment:                           - Prior to the procedure, a History and Physical                            was performed, and patient medications and                            allergies were reviewed. The patient's tolerance of                            previous anesthesia was also reviewed. The risks                            and benefits of the procedure and the sedation                            options and risks were discussed with the patient.                            All questions were answered, and informed consent                            was obtained. Prior Anticoagulants: The patient has                            taken no previous anticoagulant or antiplatelet                            agents. ASA Grade Assessment: III - A patient with                            severe systemic disease. After reviewing the risks                            and benefits, the patient was deemed in                            satisfactory condition to undergo the procedure.                           After obtaining informed consent, the colonoscope  was passed under direct vision. Throughout the                            procedure, the patient's blood pressure, pulse, and                            oxygen saturations were monitored continuously. The                            Colonoscope was introduced through the anus and                            advanced to the the cecum, identified by                            appendiceal orifice and ileocecal valve.  The                            colonoscopy was performed without difficulty. The                            patient tolerated the procedure well. The quality                            of the bowel preparation was good. The ileocecal                            valve, appendiceal orifice, and rectum were                            photographed. Scope In: 10:09:51 AM Scope Out: 10:23:08 AM Scope Withdrawal Time: 0 hours 9 minutes 41 seconds  Total Procedure Duration: 0 hours 13 minutes 17 seconds  Findings:                 Three sessile polyps were found in the descending                            colon and transverse colon. The polyps were 3 to 7                            mm in size. These polyps were removed with a cold                            snare. Resection and retrieval were complete.                           Multiple small and large-mouthed diverticula were                            found in the left colon.                           The exam was otherwise without abnormality on  direct and retroflexion views. Complications:            No immediate complications. Estimated blood loss:                            None. Estimated Blood Loss:     Estimated blood loss: none. Impression:               - Three 3 to 7 mm polyps in the descending colon                            and in the transverse colon, removed with a cold                            snare. Resected and retrieved.                           - Diverticulosis in the left colon.                           - The examination was otherwise normal on direct                            and retroflexion views. Recommendation:           - Patient has a contact number available for                            emergencies. The signs and symptoms of potential                            delayed complications were discussed with the                            patient. Return to normal activities tomorrow.                             Written discharge instructions were provided to the                            patient.                           - Resume previous diet.                           - Continue present medications.                           - Await pathology results. Milus Banister, MD 09/19/2019 10:27:58 AM This report has been signed electronically.

## 2019-09-19 NOTE — Progress Notes (Signed)
Report given to PACU, vss 

## 2019-09-19 NOTE — Progress Notes (Signed)
VS by KA. Temp by LC.

## 2019-09-19 NOTE — Patient Instructions (Signed)
YOU HAD AN ENDOSCOPIC PROCEDURE TODAY AT THE [facility]:   Refer to the procedure report that was given to you for any specific questions about what was found during the examination.  If the procedure report does not answer your questions, please call your gastroenterologist to clarify.  If you requested that your care partner not be given the details of your procedure findings, then the procedure report has been included in a sealed envelope for you to review at your convenience later.  YOU SHOULD EXPECT: Some feelings of bloating in the abdomen. Passage of more gas than usual.  Walking can help get rid of the air that was put into your GI tract during the procedure and reduce the bloating. If you had a lower endoscopy (such as a colonoscopy or flexible sigmoidoscopy) you may notice spotting of blood in your stool or on the toilet paper. If you underwent a bowel prep for your procedure, you may not have a normal bowel movement for a few days.  Please Note:  You might notice some irritation and congestion in your nose or some drainage.  This is from the oxygen used during your procedure.  There is no need for concern and it should clear up in a day or so.  SYMPTOMS TO REPORT IMMEDIATELY:   Following lower endoscopy (colonoscopy or flexible sigmoidoscopy):  Excessive amounts of blood in the stool  Significant tenderness or worsening of abdominal pains  Swelling of the abdomen that is new, acute  Fever of 100F or higher   For urgent or emergent issues, a gastroenterologist can be reached at any hour by calling [phone].   DIET:  We do recommend a small meal at first, but then you may proceed to your regular diet.  Drink plenty of fluids but you should avoid alcoholic beverages for 24 hours.  MEDICATIONS: Continue present medications.  Please see handouts given to you by your recovery nurse.  ACTIVITY:  You should plan to take it easy for the rest of today and you should  NOT DRIVE or use heavy machinery until tomorrow (because of the sedation medicines used during the test).    FOLLOW UP: Our staff will call the number listed on your records 48-72 hours following your procedure to check on you and address any questions or concerns that you may have regarding the information given to you following your procedure. If we do not reach you, we will leave a message.  We will attempt to reach you two times.  During this call, we will ask if you have developed any symptoms of COVID 19. If you develop any symptoms (ie: fever, flu-like symptoms, shortness of breath, cough etc.) before then, please call [phone].  If you test positive for Covid 19 in the 2 weeks post procedure, please call and report this information to us.    If any biopsies were taken you will be contacted by phone or by letter within the next 1-3 weeks.  Please call us at [phone] if you have not heard about the biopsies in 3 weeks.   Thank you for allowing us to provide for your healthcare needs today.   SIGNATURES/CONFIDENTIALITY: You and/or your care partner have signed paperwork which will be entered into your electronic medical record.  These signatures attest to the fact that that the information above on your After Visit Summary has been reviewed and is understood.  Full responsibility of the confidentiality of this discharge information lies with you and/or   your care-partner. 

## 2019-09-21 ENCOUNTER — Telehealth: Payer: Self-pay | Admitting: *Deleted

## 2019-09-21 NOTE — Telephone Encounter (Signed)
  Follow up Call-  Call back number 09/19/2019  Post procedure Call Back phone  # [phone]  Permission to leave phone message No  Some recent data might be hidden     Patient questions:  Do you have a fever, pain , or abdominal swelling? No. Pain Score  0 *  Have you tolerated food without any problems? Yes.    Have you been able to return to your normal activities? Yes.    Do you have any questions about your discharge instructions: Diet   No. Medications  No. Follow up visit  No.  Do you have questions or concerns about your Care? Yes.    Actions: * If pain score is 4 or above: No action needed, pain <4.  1. Have you developed a fever since your procedure?no  2.   Have you had an respiratory symptoms (SOB or cough) since your procedure? no  3.   Have you tested positive for COVID 19 since your procedure no  4.   Have you had any family members/close contacts diagnosed with the COVID 19 since your procedure?  no   If yes to any of these questions please route to Joylene John, RN and Alphonsa Gin, Therapist, sports.

## 2019-09-26 ENCOUNTER — Encounter: Payer: Self-pay | Admitting: Gastroenterology

## 2019-12-14 ENCOUNTER — Encounter: Payer: Self-pay | Admitting: Family Medicine

## 2020-04-25 ENCOUNTER — Encounter: Payer: Self-pay | Admitting: Family Medicine

## 2020-06-25 ENCOUNTER — Other Ambulatory Visit: Payer: Self-pay

## 2020-06-25 ENCOUNTER — Ambulatory Visit (INDEPENDENT_AMBULATORY_CARE_PROVIDER_SITE_OTHER): Payer: BLUE CROSS/BLUE SHIELD | Admitting: Family Medicine

## 2020-06-25 ENCOUNTER — Encounter: Payer: Self-pay | Admitting: Family Medicine

## 2020-06-25 VITALS — BP 117/79 | HR 73 | Temp 96.2°F | Ht 61.0 in | Wt 220.4 lb

## 2020-06-25 DIAGNOSIS — E559 Vitamin D deficiency, unspecified: Secondary | ICD-10-CM

## 2020-06-25 DIAGNOSIS — Z0000 Encounter for general adult medical examination without abnormal findings: Secondary | ICD-10-CM | POA: Diagnosis not present

## 2020-06-25 DIAGNOSIS — M8589 Other specified disorders of bone density and structure, multiple sites: Secondary | ICD-10-CM | POA: Diagnosis not present

## 2020-06-25 DIAGNOSIS — E7800 Pure hypercholesterolemia, unspecified: Secondary | ICD-10-CM | POA: Diagnosis not present

## 2020-06-25 LAB — COMPLETE METABOLIC PANEL WITH GFR
AG Ratio: 1.8 (calc) (ref 1.0–2.5)
ALT: 14 U/L (ref 6–29)
AST: 21 U/L (ref 10–35)
Albumin: 4.6 g/dL (ref 3.6–5.1)
Alkaline phosphatase (APISO): 64 U/L (ref 37–153)
BUN: 19 mg/dL (ref 7–25)
CO2: 25 mmol/L (ref 20–32)
Calcium: 9.3 mg/dL (ref 8.6–10.4)
Chloride: 103 mmol/L (ref 98–110)
Creat: 0.72 mg/dL (ref 0.50–0.99)
GFR, Est African American: 102 mL/min/{1.73_m2} (ref >=60)
GFR, Est Non African American: 88 mL/min/{1.73_m2} (ref >=60)
Globulin: 2.5 g/dL (calc) (ref 1.9–3.7)
Glucose, Bld: 92 mg/dL (ref 65–99)
Potassium: 4.1 mmol/L (ref 3.5–5.3)
Sodium: 138 mmol/L (ref 135–146)
Total Bilirubin: 1 mg/dL (ref 0.2–1.2)
Total Protein: 7.1 g/dL (ref 6.1–8.1)

## 2020-06-25 LAB — CBC WITH DIFFERENTIAL/PLATELET
Absolute Monocytes: 504 cells/uL (ref 200–950)
Basophils Absolute: 21 cells/uL (ref 0–200)
Basophils Relative: 0.3 %
Eosinophils Absolute: 62 cells/uL (ref 15–500)
Eosinophils Relative: 0.9 %
HCT: 40.7 % (ref 35.0–45.0)
Hemoglobin: 13.8 g/dL (ref 11.7–15.5)
Lymphs Abs: 2049 cells/uL (ref 850–3900)
MCH: 32.2 pg (ref 27.0–33.0)
MCHC: 33.9 g/dL (ref 32.0–36.0)
MCV: 95.1 fL (ref 80.0–100.0)
MPV: 9.8 fL (ref 7.5–12.5)
Monocytes Relative: 7.3 %
Neutro Abs: 4264 cells/uL (ref 1500–7800)
Neutrophils Relative %: 61.8 %
Platelets: 188 10*3/uL (ref 140–400)
RBC: 4.28 10*6/uL (ref 3.80–5.10)
RDW: 12.3 % (ref 11.0–15.0)
Total Lymphocyte: 29.7 %
WBC: 6.9 10*3/uL (ref 3.8–10.8)

## 2020-06-25 LAB — LIPID PANEL
Cholesterol: 187 mg/dL (ref <200)
HDL: 53 mg/dL (ref >=50)
LDL Cholesterol (Calc): 95 mg/dL (calc)
Non-HDL Cholesterol (Calc): 134 mg/dL (calc) — ABNORMAL HIGH (ref <130)
Total CHOL/HDL Ratio: 3.5 (calc) (ref <5.0)
Triglycerides: 272 mg/dL — ABNORMAL HIGH (ref <150)

## 2020-06-25 LAB — VITAMIN D 25 HYDROXY (VIT D DEFICIENCY, FRACTURES): Vit D, 25-Hydroxy: 46 ng/mL (ref 30–100)

## 2020-06-25 LAB — TSH: TSH: 2.56 mIU/L (ref 0.40–4.50)

## 2020-06-25 NOTE — Patient Instructions (Signed)
Health Maintenance  Topic Date Due  . MAMMOGRAM  09/23/2018  . DEXA SCAN  Never done  . PNA vac Low Risk Adult (1 of 2 - PCV13) 06/01/2020  . INFLUENZA VACCINE  11/06/2020 (Originally 03/09/2020)  . COLONOSCOPY  09/18/2024  . TETANUS/TDAP  10/12/2026  . COVID-19 Vaccine  Completed  . Hepatitis C Screening  Completed  . HIV Screening  Completed  . PAP SMEAR-Modifier  Discontinued    Ordered bone scan. You can do both mammogram and bone scan at same time.  You look great! See you in a year or sooner if needed.     Preventive Care 23 Years and Older, Female Preventive care refers to lifestyle choices and visits with your health care provider that can promote health and wellness. This includes:  A yearly physical exam. This is also called an annual well check.  Regular dental and eye exams.  Immunizations.  Screening for certain conditions.  Healthy lifestyle choices, such as diet and exercise. What can I expect for my preventive care visit? Physical exam Your health care provider will check:  Height and weight. These may be used to calculate body mass index (BMI), which is a measurement that tells if you are at a healthy weight.  Heart rate and blood pressure.  Your skin for abnormal spots. Counseling Your health care provider may ask you questions about:  Alcohol, tobacco, and drug use.  Emotional well-being.  Home and relationship well-being.  Sexual activity.  Eating habits.  History of falls.  Memory and ability to understand (cognition).  Work and work Statistician.  Pregnancy and menstrual history. What immunizations do I need?  Influenza (flu) vaccine  This is recommended every year. Tetanus, diphtheria, and pertussis (Tdap) vaccine  You may need a Td booster every 10 years. Varicella (chickenpox) vaccine  You may need this vaccine if you have not already been vaccinated. Zoster (shingles) vaccine  You may need this after age 47. Pneumococcal  conjugate (PCV13) vaccine  One dose is recommended after age 18. Pneumococcal polysaccharide (PPSV23) vaccine  One dose is recommended after age 56. Measles, mumps, and rubella (MMR) vaccine  You may need at least one dose of MMR if you were born in 1957 or later. You may also need a second dose. Meningococcal conjugate (MenACWY) vaccine  You may need this if you have certain conditions. Hepatitis A vaccine  You may need this if you have certain conditions or if you travel or work in places where you may be exposed to hepatitis A. Hepatitis B vaccine  You may need this if you have certain conditions or if you travel or work in places where you may be exposed to hepatitis B. Haemophilus influenzae type b (Hib) vaccine  You may need this if you have certain conditions. You may receive vaccines as individual doses or as more than one vaccine together in one shot (combination vaccines). Talk with your health care provider about the risks and benefits of combination vaccines. What tests do I need? Blood tests  Lipid and cholesterol levels. These may be checked every 5 years, or more frequently depending on your overall health.  Hepatitis C test.  Hepatitis B test. Screening  Lung cancer screening. You may have this screening every year starting at age 78 if you have a 30-pack-year history of smoking and currently smoke or have quit within the past 15 years.  Colorectal cancer screening. All adults should have this screening starting at age 67 and continuing until  age 29. Your health care provider may recommend screening at age 2 if you are at increased risk. You will have tests every 1-10 years, depending on your results and the type of screening test.  Diabetes screening. This is done by checking your blood sugar (glucose) after you have not eaten for a while (fasting). You may have this done every 1-3 years.  Mammogram. This may be done every 1-2 years. Talk with your health care  provider about how often you should have regular mammograms.  BRCA-related cancer screening. This may be done if you have a family history of breast, ovarian, tubal, or peritoneal cancers. Other tests  Sexually transmitted disease (STD) testing.  Bone density scan. This is done to screen for osteoporosis. You may have this done starting at age 79. Follow these instructions at home: Eating and drinking  Eat a diet that includes fresh fruits and vegetables, whole grains, lean protein, and low-fat dairy products. Limit your intake of foods with high amounts of sugar, saturated fats, and salt.  Take vitamin and mineral supplements as recommended by your health care provider.  Do not drink alcohol if your health care provider tells you not to drink.  If you drink alcohol: ? Limit how much you have to 0-1 drink a day. ? Be aware of how much alcohol is in your drink. In the [place] one drink equals one 12 oz bottle of beer (355 mL), one 5 oz glass of wine (148 mL), or one 1 oz glass of hard liquor (44 mL). Lifestyle  Take daily care of your teeth and gums.  Stay active. Exercise for at least 30 minutes on 5 or more days each week.  Do not use any products that contain nicotine or tobacco, such as cigarettes, e-cigarettes, and chewing tobacco. If you need help quitting, ask your health care provider.  If you are sexually active, practice safe sex. Use a condom or other form of protection in order to prevent STIs (sexually transmitted infections).  Talk with your health care provider about taking a low-dose aspirin or statin. What's next?  Go to your health care provider once a year for a well check visit.  Ask your health care provider how often you should have your eyes and teeth checked.  Stay up to date on all vaccines. This information is not intended to replace advice given to you by your health care provider. Make sure you discuss any questions you have with your health care  provider. Document Revised: 07/20/2018 Document Reviewed: 07/20/2018 Elsevier Patient Education  2020 [employer].

## 2020-06-25 NOTE — Progress Notes (Signed)
Patient: Suzanne Kelley MRN: [id number] DOB: 07/18/1956 PCP: Orma Flaming, MD     Subjective:  Chief Complaint  Patient presents with  . Annual Exam  . Hyperlipidemia  . osteopenia  . vitamin d deficiency    HPI: The patient is a 64 y.o. female who presents today for annual exam. She denies any changes to past medical history. There have been no recent hospitalizations. She is following a well balanced diet and exercise plan. She walks on the treadmill 4-5 times weekly for an hour. Weight has been decreasing steadily. No complaints today.   Has had her covid vaccines.   No family history of colon cancer or breast cancer. Dad died from renal failure in his 48's. Her mother was in a NH. She didn't take care of herself. Unsure of history of CAD/DM/HTN. ? PD in her mother.   Osteopenia Due for dexa this year or next year. On calcium/vitamin D.   Hyperlipidemia  Currently on crestor 10mg /day. We started her back on medication at her last visit.  The 10-year ASCVD risk score Mikey Bussing DC Jr., et al., 2013) is: 6%   Immunization History  Administered Date(s) Administered  . Influenza-Unspecified 05/15/2018  . PFIZER SARS-COV-2 Vaccination 04/01/2020, 04/22/2020  . Tdap 10/11/2016   Colonoscopy: 09/19/2019. Repeat in 5 years.  Mammogram: 09/23/2016 Pap smear: n/a     Review of Systems  Constitutional: Negative for chills, fatigue and fever.  HENT: Negative for dental problem, ear pain, hearing loss and trouble swallowing.   Eyes: Negative for visual disturbance.  Respiratory: Negative for cough, chest tightness and shortness of breath.   Cardiovascular: Negative for chest pain, palpitations and leg swelling.  Gastrointestinal: Negative for abdominal pain, blood in stool, diarrhea and nausea.  Endocrine: Negative for cold intolerance, polydipsia, polyphagia and polyuria.  Genitourinary: Negative for dysuria, frequency, hematuria and pelvic pain.  Musculoskeletal: Negative for  arthralgias.  Skin: Negative for rash.  Neurological: Negative for dizziness and headaches.  Psychiatric/Behavioral: Negative for dysphoric mood and sleep disturbance. The patient is not nervous/anxious.     Allergies Patient has No Known Allergies.  Past Medical History Patient  has a past medical history of High cholesterol, History of chicken pox, Hyperlipidemia, Uterine cancer ([place]), and UTI (urinary tract infection).  Surgical History Patient  has a past surgical history that includes Abdominal hysterectomy and Appendectomy.  Family History Pateint's family history includes CAD in her brother; COPD in her mother; Diabetes in her mother; Early death in her brother; Heart disease in her brother; Kidney failure in her father; Parkinson's disease in her mother.  Social History Patient  reports that she has never smoked. She has never used smokeless tobacco. She reports current alcohol use. She reports that she does not use drugs.    Objective: Vitals:   06/25/20 0912  BP: 117/79  Pulse: 73  Temp: (!) 96.2 F (35.7 C)  TempSrc: Temporal  SpO2: 100%  Weight: 220 lb 6.4 oz (100 kg)  Height: 5\' 1"  (1.549 m)    Body mass index is 41.64 kg/m.  Physical Exam Vitals reviewed.  Constitutional:      Appearance: Normal appearance. She is well-developed. She is obese.  HENT:     Head: Normocephalic and atraumatic.     Right Ear: Tympanic membrane, ear canal and external ear normal.     Left Ear: Tympanic membrane, ear canal and external ear normal.     Mouth/Throat:     Mouth: Mucous membranes are moist.  Eyes:  Extraocular Movements: Extraocular movements intact.     Conjunctiva/sclera: Conjunctivae normal.     Pupils: Pupils are equal, round, and reactive to light.  Neck:     Thyroid: No thyromegaly.     Vascular: No carotid bruit.  Cardiovascular:     Rate and Rhythm: Normal rate and regular rhythm.     Pulses: Normal pulses.     Heart sounds: Normal heart sounds.  No murmur heard.   Pulmonary:     Effort: Pulmonary effort is normal.     Breath sounds: Normal breath sounds.  Abdominal:     General: Bowel sounds are normal. There is no distension.     Palpations: Abdomen is soft.     Tenderness: There is no abdominal tenderness.  Musculoskeletal:     Cervical back: Normal range of motion and neck supple.  Lymphadenopathy:     Cervical: No cervical adenopathy.  Skin:    General: Skin is warm and dry.     Capillary Refill: Capillary refill takes less than 2 seconds.     Findings: No rash.  Neurological:     General: No focal deficit present.     Mental Status: She is alert and oriented to person, place, and time.     Cranial Nerves: No cranial nerve deficit.     Coordination: Coordination normal.     Deep Tendon Reflexes: Reflexes normal.  Psychiatric:        Mood and Affect: Mood normal.        Behavior: Behavior normal.          Office Visit from 06/25/2020 in [facility]  PHQ-2 Total Score 0      Assessment/plan: 1. Annual physical exam HM reviewed and UTD except mammogram. Handout given with this and she will get done with her bone scan. UTD on vaccines. Declines flu shot. Is walking on her treadmill and encouraged her to continue this to help lose weight. Overall doing well. F/u one year or as needed.  Patient counseling [x]    Nutrition: Stressed importance of moderation in sodium/caffeine intake, saturated fat and cholesterol, caloric balance, sufficient intake of fresh fruits, vegetables, fiber, calcium, iron, and 1 mg of folate supplement per day (for females capable of pregnancy).  [x]    Stressed the importance of regular exercise.   []    Substance Abuse: Discussed cessation/primary prevention of tobacco, alcohol, or other drug use; driving or other dangerous activities under the influence; availability of treatment for abuse.   [x]    Injury prevention: Discussed safety belts, safety helmets, smoke  detector, smoking near bedding or upholstery.   [x]    Sexuality: Discussed sexually transmitted diseases, partner selection, use of condoms, avoidance of unintended pregnancy  and contraceptive alternatives.  [x]    Dental health: Discussed importance of regular tooth brushing, flossing, and dental visits.  [x]    Health maintenance and immunizations reviewed. Please refer to Health maintenance section.    - CBC with Differential/Platelet; Future - TSH; Future  2. Hypercholesterolemia Continue crestor. Repeat lipid today. She is fasting since we started her back on medication. She has f/u with cardiology next month.  - Lipid panel; Future - COMPLETE METABOLIC PANEL WITH GFR; Future  3. Vitamin D deficiency  - VITAMIN D 25 Hydroxy (Vit-D Deficiency, Fractures); Future  4. Osteopenia of multiple sites  - DG Bone Density; Future  This visit occurred during the SARS-CoV-2 public health emergency.  Safety protocols were in place, including screening questions prior to the visit, additional  usage of staff PPE, and extensive cleaning of exam room while observing appropriate contact time as indicated for disinfecting solutions.    Return in about 1 year (around 06/25/2021) for or sooner if needed. Orma Flaming, MD [place]  06/25/2020

## 2020-06-26 ENCOUNTER — Encounter: Payer: Self-pay | Admitting: Family Medicine

## 2020-06-28 ENCOUNTER — Other Ambulatory Visit: Payer: Self-pay

## 2020-06-28 MED ORDER — ROSUVASTATIN CALCIUM 10 MG PO TABS
10.0000 mg | ORAL_TABLET | Freq: Every day | ORAL | 3 refills | Status: DC
Start: 2020-06-28 — End: 2021-07-01

## 2020-08-14 ENCOUNTER — Other Ambulatory Visit: Payer: Self-pay | Admitting: Family Medicine

## 2020-09-02 ENCOUNTER — Ambulatory Visit: Payer: BLUE CROSS/BLUE SHIELD | Admitting: Cardiology

## 2020-10-02 ENCOUNTER — Other Ambulatory Visit: Payer: Self-pay | Admitting: Family Medicine

## 2020-10-02 ENCOUNTER — Encounter: Payer: Self-pay | Admitting: Family Medicine

## 2020-10-02 DIAGNOSIS — Z0000 Encounter for general adult medical examination without abnormal findings: Secondary | ICD-10-CM

## 2020-10-03 ENCOUNTER — Other Ambulatory Visit: Payer: Self-pay

## 2020-10-03 DIAGNOSIS — M8589 Other specified disorders of bone density and structure, multiple sites: Secondary | ICD-10-CM

## 2020-10-25 NOTE — Progress Notes (Signed)
HPI: FU palpitations.  Patient not seen since January 2018.Echocardiogram January 2018 showednormal LV function, grade 2 diastolic dysfunction and mild left atrial enlargement.  Exercise treadmill February 2018 negative. Carotid Dopplers March 2018 showed 1 to 39% bilateral stenosis.  Since last seen, she denies chest pain.  She has dyspnea with more vigorous activities but not routine activities.  No orthopnea, PND, pedal edema or syncope.  Her palpitations have resolved.  Current Outpatient Medications  Medication Sig Dispense Refill  . Cholecalciferol (VITAMIN D PO) Take 1 tablet by mouth daily.    . Multiple Vitamin (THERA) TABS Take 1 tablet by mouth daily.    . rosuvastatin (CRESTOR) 10 MG tablet Take 1 tablet (10 mg total) by mouth daily. 90 tablet 3   Current Facility-Administered Medications  Medication Dose Route Frequency Provider Last Rate Last Admin  . 0.9 %  sodium chloride infusion  500 mL Intravenous Once Milus Banister, MD         Past Medical History:  Diagnosis Date  . High cholesterol   . History of chicken pox   . Hyperlipidemia   . Uterine cancer ([place])   . UTI (urinary tract infection)     Past Surgical History:  Procedure Laterality Date  . ABDOMINAL HYSTERECTOMY    . APPENDECTOMY      Social History   Socioeconomic History  . Marital status: Married    Spouse name: Not on file  . Number of children: 2  . Years of education: Not on file  . Highest education level: Not on file  Occupational History  . Not on file  Tobacco Use  . Smoking status: Never Smoker  . Smokeless tobacco: Never Used  Vaping Use  . Vaping Use: Never used  Substance and Sexual Activity  . Alcohol use: Yes    Comment: Rare  . Drug use: Never  . Sexual activity: Not on file  Other Topics Concern  . Not on file  Social History Narrative  . Not on file   Social Determinants of Health   Financial Resource Strain: Not on file  Food Insecurity: Not on file   Transportation Needs: Not on file  Physical Activity: Not on file  Stress: Not on file  Social Connections: Not on file  Intimate Partner Violence: Not on file    Family History  Problem Relation Age of Onset  . Diabetes Mother   . Parkinson's disease Mother   . COPD Mother   . Kidney failure Father   . CAD Brother   . Early death Brother   . Heart disease Brother   . Colon cancer Neg Hx   . Rectal cancer Neg Hx   . Stomach cancer Neg Hx     ROS: no fevers or chills, productive cough, hemoptysis, dysphasia, odynophagia, melena, hematochezia, dysuria, hematuria, rash, seizure activity, orthopnea, PND, pedal edema, claudication. Remaining systems are negative.  Physical Exam: Well-developed obese in no acute distress.  Skin is warm and dry.  HEENT is normal.  Neck is supple.  Chest is clear to auscultation with normal expansion.  Cardiovascular exam is regular rate and rhythm.  Abdominal exam nontender or distended. No masses palpated. Extremities show no edema. neuro grossly intact  ECG-normal sinus rhythm at a rate of 80, left axis deviation, no significant ST changes.  Personally reviewed  A/P  1 abnormal ECG-previous evaluation including exercise treadmill and echocardiogram unremarkable.  No recurrent symptoms.  No plans for further evaluation.  2 palpitations-symptoms resolved.  Can consider a beta-blocker in the future if needed.  3 hyperlipidemia-managed by primary care.  4 obesity-needs weight loss.  Kirk Ruths, MD

## 2020-10-28 ENCOUNTER — Ambulatory Visit (INDEPENDENT_AMBULATORY_CARE_PROVIDER_SITE_OTHER): Payer: BLUE CROSS/BLUE SHIELD | Admitting: Cardiology

## 2020-10-28 ENCOUNTER — Other Ambulatory Visit: Payer: Self-pay

## 2020-10-28 ENCOUNTER — Encounter: Payer: Self-pay | Admitting: Cardiology

## 2020-10-28 VITALS — BP 122/84 | HR 80 | Ht 61.0 in | Wt 215.0 lb

## 2020-10-28 DIAGNOSIS — R002 Palpitations: Secondary | ICD-10-CM

## 2020-10-28 DIAGNOSIS — R9431 Abnormal electrocardiogram [ECG] [EKG]: Secondary | ICD-10-CM | POA: Diagnosis not present

## 2020-10-28 DIAGNOSIS — E7800 Pure hypercholesterolemia, unspecified: Secondary | ICD-10-CM

## 2020-10-28 NOTE — Patient Instructions (Signed)

## 2020-11-04 ENCOUNTER — Other Ambulatory Visit: Payer: Self-pay

## 2020-11-04 ENCOUNTER — Other Ambulatory Visit: Payer: BLUE CROSS/BLUE SHIELD | Admitting: *Deleted

## 2020-11-04 DIAGNOSIS — R002 Palpitations: Secondary | ICD-10-CM | POA: Diagnosis not present

## 2020-11-04 NOTE — Progress Notes (Unsigned)
1.) Reason for visit: EKG  2.) Name of MD requesting visit: crenshaw  3.) H&P: ecg from recent visit name and rhythm marked out  4.) ROS related to problem: no complaints  5.) Assessment and plan per MD: EKG complete

## 2020-11-25 ENCOUNTER — Ambulatory Visit: Payer: BLUE CROSS/BLUE SHIELD

## 2020-12-09 ENCOUNTER — Other Ambulatory Visit: Payer: Self-pay

## 2020-12-09 ENCOUNTER — Ambulatory Visit
Admission: RE | Admit: 2020-12-09 | Discharge: 2020-12-09 | Disposition: A | Discharge status: Home / Self Care | Payer: BLUE CROSS/BLUE SHIELD | Source: Ambulatory Visit | Attending: Family Medicine | Admitting: Family Medicine

## 2020-12-09 DIAGNOSIS — Z1231 Encounter for screening mammogram for malignant neoplasm of breast: Secondary | ICD-10-CM | POA: Diagnosis not present

## 2020-12-09 DIAGNOSIS — Z0000 Encounter for general adult medical examination without abnormal findings: Secondary | ICD-10-CM

## 2020-12-14 ENCOUNTER — Emergency Department (HOSPITAL_BASED_OUTPATIENT_CLINIC_OR_DEPARTMENT_OTHER): Payer: BLUE CROSS/BLUE SHIELD

## 2020-12-14 ENCOUNTER — Encounter (HOSPITAL_BASED_OUTPATIENT_CLINIC_OR_DEPARTMENT_OTHER): Payer: Self-pay

## 2020-12-14 ENCOUNTER — Other Ambulatory Visit: Payer: Self-pay

## 2020-12-14 ENCOUNTER — Emergency Department (HOSPITAL_BASED_OUTPATIENT_CLINIC_OR_DEPARTMENT_OTHER)
Admission: EM | Admit: 2020-12-14 | Discharge: 2020-12-14 | Disposition: A | Discharge status: Home / Self Care | Payer: BLUE CROSS/BLUE SHIELD | Attending: Emergency Medicine | Admitting: Emergency Medicine

## 2020-12-14 DIAGNOSIS — Z8542 Personal history of malignant neoplasm of other parts of uterus: Secondary | ICD-10-CM | POA: Diagnosis not present

## 2020-12-14 DIAGNOSIS — M545 Low back pain, unspecified: Secondary | ICD-10-CM | POA: Diagnosis not present

## 2020-12-14 DIAGNOSIS — S34101A Unspecified injury to L1 level of lumbar spinal cord, initial encounter: Secondary | ICD-10-CM | POA: Diagnosis not present

## 2020-12-14 DIAGNOSIS — W19XXXA Unspecified fall, initial encounter: Secondary | ICD-10-CM

## 2020-12-14 DIAGNOSIS — S32010A Wedge compression fracture of first lumbar vertebra, initial encounter for closed fracture: Secondary | ICD-10-CM | POA: Diagnosis not present

## 2020-12-14 DIAGNOSIS — S0003XA Contusion of scalp, initial encounter: Secondary | ICD-10-CM | POA: Insufficient documentation

## 2020-12-14 DIAGNOSIS — W01198A Fall on same level from slipping, tripping and stumbling with subsequent striking against other object, initial encounter: Secondary | ICD-10-CM | POA: Diagnosis not present

## 2020-12-14 DIAGNOSIS — S0990XA Unspecified injury of head, initial encounter: Secondary | ICD-10-CM | POA: Diagnosis not present

## 2020-12-14 NOTE — ED Provider Notes (Signed)
[facility] DEPT Provider Note   CSN: [id number] Arrival date & time: 12/14/20  1212     History Chief Complaint  Patient presents with  . Fall    Suzanne Kelley is a 65 y.o. female.  She tripped over the leg of a picnic table and fell backwards striking her head on some concrete.  She fell about 5 months ago as well, and she has been dealing with some mild low back pain ever since.  Pain is improving and is worsened when she moves around.  She has been resistant to evaluation and any intervention because it has been improving with symptomatic management.  Nonetheless, her family is concerned about her.  The history is provided by the patient.  Fall This is a new problem. The current episode started less than 1 hour ago. The problem occurs constantly. The problem has not changed since onset.Pertinent negatives include no chest pain, no abdominal pain, no headaches and no shortness of breath. Nothing aggravates the symptoms. Nothing relieves the symptoms. She has tried nothing for the symptoms. The treatment provided no relief.       Past Medical History:  Diagnosis Date  . High cholesterol   . History of chicken pox   . Hyperlipidemia   . Uterine cancer ([place])   . UTI (urinary tract infection)     Patient Active Problem List   Diagnosis Date Noted  . Vitamin D deficiency 06/25/2020  . Positive colorectal cancer screening using Cologuard test 08/20/2019  . Uterine cancer ([place]) 08/01/2019  . Hypercholesterolemia 05/19/2018  . Osteopenia of multiple sites 05/19/2018    Past Surgical History:  Procedure Laterality Date  . ABDOMINAL HYSTERECTOMY    . APPENDECTOMY       OB History   No obstetric history on file.     Family History  Problem Relation Age of Onset  . Diabetes Mother   . Parkinson's disease Mother   . COPD Mother   . Kidney failure Father   . CAD Brother   . Early death Brother   . Heart disease Brother   . Colon cancer Neg  Hx   . Rectal cancer Neg Hx   . Stomach cancer Neg Hx     Social History   Tobacco Use  . Smoking status: Never Smoker  . Smokeless tobacco: Never Used  Vaping Use  . Vaping Use: Never used  Substance Use Topics  . Alcohol use: Yes    Comment: Rare  . Drug use: Never    Home Medications Prior to Admission medications   Medication Sig Start Date End Date Taking? Authorizing Provider  Cholecalciferol (VITAMIN D PO) Take 1 tablet by mouth daily.    [provider]  Multiple Vitamin (THERA) TABS Take 1 tablet by mouth daily.    [provider]  rosuvastatin (CRESTOR) 10 MG tablet Take 1 tablet (10 mg total) by mouth daily. 06/28/20   Orma Flaming, MD    Allergies    Patient has no known allergies.  Review of Systems   Review of Systems  Constitutional: Negative for chills and fever.  HENT: Negative for ear pain and sore throat.   Eyes: Negative for pain and visual disturbance.  Respiratory: Negative for cough and shortness of breath.   Cardiovascular: Negative for chest pain and palpitations.  Gastrointestinal: Negative for abdominal pain and vomiting.  Genitourinary: Negative for dysuria and hematuria.  Musculoskeletal: Positive for back pain. Negative for arthralgias.  Skin: Negative for color  change and rash.  Neurological: Negative for seizures, syncope and headaches.  All other systems reviewed and are negative.   Physical Exam Updated Vital Signs BP 118/75   Pulse 90   Temp 98.1 F (36.7 C) (Oral)   Resp 18   Ht 5\' 1"  (1.549 m)   Wt 97.5 kg   SpO2 98%   BMI 40.62 kg/m   Physical Exam Vitals and nursing note reviewed.  HENT:     Head: Normocephalic.     Comments: Contusion at the right posterior aspect of her head.  Otherwise the head is atraumatic. Eyes:     General: No scleral icterus.    Extraocular Movements: Extraocular movements intact.     Conjunctiva/sclera: Conjunctivae normal.     Pupils: Pupils are equal, round, and  reactive to light.  Pulmonary:     Effort: Pulmonary effort is normal. No respiratory distress.  Musculoskeletal:     Cervical back: Normal range of motion.     Thoracic back: No deformity. Normal range of motion.     Lumbar back: No deformity. Normal range of motion.  Skin:    General: Skin is warm and dry.  Neurological:     General: No focal deficit present.     Mental Status: She is alert.     Cranial Nerves: No cranial nerve deficit.     Sensory: No sensory deficit.  Psychiatric:        Mood and Affect: Mood normal.     ED Results / Procedures / Treatments   Labs (all labs ordered are listed, but only abnormal results are displayed) Labs Reviewed - No data to display  EKG None  Radiology DG Lumbar Spine Complete  Result Date: 12/14/2020 CLINICAL DATA:  [place] today.  Back pain. EXAM: LUMBAR SPINE - COMPLETE 4+ VIEW COMPARISON:  None. FINDINGS: Moderate compression fracture of L1.  No other fractures. Slight, grade 1, anterolisthesis of L5 on S1. No other spondylolisthesis. There are transitional thoracolumbar and lumbosacral vertebra designated L1 and L5 respectively. Lumbar discs are well preserved in height. Facet joint degenerative change noted bilaterally at L5-S1. There are numerous pelvic and central lower abdomen surgical vascular clips. Small stone projects over the lower pole of the left kidney. IMPRESSION: 1. Moderate compression fracture of transitional thoracolumbar vertebrae, designated L1, of unclear chronicity, but possibly acute. 2. No other fractures. 3. Facet degenerative change at the transitional L5-S1 level with there is a minor, grade 1 anterolisthesis. Electronically Signed   By: Lajean Manes M.D.   On: 12/14/2020 13:53   CT Head Wo Contrast  Result Date: 12/14/2020 CLINICAL DATA:  Fall over picnic table.  Occipital head injury. EXAM: CT HEAD WITHOUT CONTRAST TECHNIQUE: Contiguous axial images were obtained from the base of the skull through the vertex  without intravenous contrast. COMPARISON:  None. FINDINGS: Brain: There is no evidence of acute intracranial hemorrhage, mass lesion, brain edema or extra-axial fluid collection. The ventricles and subarachnoid spaces are appropriately sized for age. There is no CT evidence of acute cortical infarction. Vascular:  No hyperdense vessel identified. Skull: Negative for fracture or focal lesion. Sinuses/Orbits: The visualized paranasal sinuses and mastoid air cells are clear. No orbital abnormalities are seen. Other: Probable soft tissue swelling in the right parietal scalp without evidence of foreign body. IMPRESSION: Probable right parietal scalp soft tissue injury. No acute intracranial findings or evidence of calvarial fracture. Electronically Signed   By: Richardean Sale M.D.   On: 12/14/2020 13:40  Procedures Procedures   Medications Ordered in ED Medications - No data to display  ED Course  I have reviewed the triage vital signs and the nursing notes.  Pertinent labs & imaging results that were available during my care of the patient were reviewed by me and considered in my medical decision making (see chart for details).    MDM Rules/Calculators/A&P                          Luna Fuse resents after a mechanical fall.  She was evaluated for intracranial trauma, and her head CT was normal.  I did x-ray her back upon request from the patient and her family.  It looks like her fall several months ago probably resulted in a lumbar compression fracture.  She is recovering nicely, and she was advised to continue symptomatic management.  The patient also has a bone density scan in several months, and she was advised on the importance of making sure that she cares for her bone density. Final Clinical Impression(s) / ED Diagnoses Final diagnoses:  Fall, initial encounter  Contusion of scalp, initial encounter  Compression fracture of L1 vertebra, initial encounter [facility])    Rx / [place]  Orders ED Discharge Orders    None       Arnaldo Natal, MD 12/14/20 1553

## 2020-12-14 NOTE — ED Triage Notes (Signed)
Pt presents after experiencing a mechanical fall over a picnic table. Pt c/o injury to the occipital area. Denies LOC or dizziness. Pt denies being on blood thinners.

## 2021-01-09 ENCOUNTER — Telehealth: Payer: Self-pay | Admitting: Gastroenterology

## 2021-01-09 NOTE — Telephone Encounter (Signed)
That is something that is used when the pt has a procedure.  It is not an ongoing medication.  It can't be removed to my knowledge.

## 2021-01-09 NOTE — Telephone Encounter (Signed)
Inbound call from pt stating that she was trying see if her sodium chloride can be removed from her mychart if that is possible. Please advise

## 2021-01-11 ENCOUNTER — Encounter: Payer: Self-pay | Admitting: Family Medicine

## 2021-02-26 ENCOUNTER — Other Ambulatory Visit: Payer: Self-pay

## 2021-02-26 ENCOUNTER — Encounter: Payer: Self-pay | Admitting: Family Medicine

## 2021-02-26 ENCOUNTER — Ambulatory Visit (HOSPITAL_BASED_OUTPATIENT_CLINIC_OR_DEPARTMENT_OTHER)
Admission: RE | Admit: 2021-02-26 | Discharge: 2021-02-26 | Disposition: A | Discharge status: Home / Self Care | Payer: BLUE CROSS/BLUE SHIELD | Source: Ambulatory Visit | Attending: Family Medicine | Admitting: Family Medicine

## 2021-02-26 DIAGNOSIS — M8589 Other specified disorders of bone density and structure, multiple sites: Secondary | ICD-10-CM | POA: Diagnosis not present

## 2021-02-26 DIAGNOSIS — M85852 Other specified disorders of bone density and structure, left thigh: Secondary | ICD-10-CM | POA: Diagnosis not present

## 2021-02-26 DIAGNOSIS — Z780 Asymptomatic menopausal state: Secondary | ICD-10-CM | POA: Diagnosis not present

## 2021-02-27 ENCOUNTER — Encounter: Payer: Self-pay | Admitting: Family Medicine

## 2021-03-02 ENCOUNTER — Encounter: Payer: Self-pay | Admitting: Family Medicine

## 2021-03-09 ENCOUNTER — Encounter: Payer: Self-pay | Admitting: Family Medicine

## 2021-03-09 ENCOUNTER — Ambulatory Visit: Payer: BLUE CROSS/BLUE SHIELD | Admitting: Physician Assistant

## 2021-03-09 ENCOUNTER — Ambulatory Visit (INDEPENDENT_AMBULATORY_CARE_PROVIDER_SITE_OTHER): Payer: BLUE CROSS/BLUE SHIELD | Admitting: Family Medicine

## 2021-03-09 ENCOUNTER — Other Ambulatory Visit: Payer: Self-pay

## 2021-03-09 VITALS — BP 130/80 | HR 74 | Temp 98.1°F | Ht 61.0 in | Wt 223.0 lb

## 2021-03-09 DIAGNOSIS — S32010A Wedge compression fracture of first lumbar vertebra, initial encounter for closed fracture: Secondary | ICD-10-CM

## 2021-03-09 DIAGNOSIS — M8589 Other specified disorders of bone density and structure, multiple sites: Secondary | ICD-10-CM | POA: Diagnosis not present

## 2021-03-09 MED ORDER — ALENDRONATE SODIUM 70 MG PO TABS: 70.0000 mg | ORAL_TABLET | ORAL | 11 refills | Status: DC

## 2021-03-09 NOTE — Assessment & Plan Note (Signed)
Pain currently manageable.  Will place referral to physical therapy for further management.  We will be starting bisphosphonate due to elevated FRAX score of 11.  We will start Fosamax 70 mg weekly.  Discussed potential side effects.  We can repeat DEXA scan in 2 years.

## 2021-03-09 NOTE — Patient Instructions (Signed)
It was very nice to see you today!  I will refer you to see physical therapist.  Please start the Fosamax.  This will help rebuild bone.  We can recheck a bone density scan in a couple of years.  I will see you back in a few months for your annual physical.  This could be [place] office to get an x-ray.  Take care, Dr Jerline Pain  PLEASE NOTE:  If you had any lab tests please let us know if you have not heard back within a few days. You may see your results on mychart before we have a chance to review them but we will give you a call once they are reviewed by [place]. If we ordered any referrals today, please let us know if you have not heard from their office within the next week.   Please try these tips to maintain a healthy lifestyle:  Eat at least 3 REAL meals and 1-2 snacks per day.  Aim for no more than 5 hours between eating.  If you eat breakfast, please do so within one hour of getting up.   Each meal should contain half fruits/vegetables, one quarter protein, and one quarter carbs (no bigger than a computer mouse)  Cut down on sweet beverages. This includes juice, soda, and sweet tea.   Drink at least 1 glass of water with each meal and aim for at least 8 glasses per day  Exercise at least 150 minutes every week.

## 2021-03-09 NOTE — Progress Notes (Signed)
   Suzanne Kelley is a 65 y.o. female who presents today for an office visit.  Assessment/Plan:  Chronic Problems Addressed Today: Closed compression fracture of body of L1 vertebra (HCC) Pain currently manageable.  Will place referral to physical therapy for further management.  We will be starting bisphosphonate due to elevated FRAX score of 11.  We will start Fosamax 70 mg weekly.  Discussed potential side effects.  We can repeat DEXA scan in 2 years.  Osteopenia of multiple sites She is on calcium and vitamin D supplementation.  Her T score is -1.6 based on DEXA scan from a couple of weeks ago.  Due to her vertebral fracture we will be starting bisphosphonate therapy.  She can repeat DEXA scan in 2 years.     Subjective:  HPI:  Patient here for compression fracture follow up.  Had x-ray done a couple of months ago which showed L1 compression fracture.  Note getting a bone density scan done a couple of weeks ago which showed osteoporosis.  She still has some lingering pain when standing.  He is wondering what exercises are safe for her to be able to do.  She does have a remote history of a fall last year but is not sure if this was because of fracture or not.  She admits to having trouble standing for 10-15 minutes at a time. Moreover, she admits to feeling pain when standing or when going down stairs. She states that the difficulty standing is being caused by a "weird feeling" going across her abdominal area above the hips.        Objective:  Physical Exam: BP 130/80   Pulse 74   Temp 98.1 F (36.7 C) (Temporal)   Ht '5\' 1"'$  (1.549 m)   Wt 223 lb (101.2 kg)   SpO2 98%   BMI 42.14 kg/m   Gen: No acute distress, resting comfortably CV: Regular rate and rhythm with no murmurs appreciated Pulm: Normal work of breathing, clear to auscultation bilaterally with no crackles, wheezes, or rhonchi Neuro: Grossly normal, moves all extremities Psych: Normal affect and thought content       I,Jordan Kelly,acting as a scribe for Dimas Chyle, MD.,have documented all relevant documentation on the behalf of Dimas Chyle, MD,as directed by  Dimas Chyle, MD while in the presence of Dimas Chyle, MD.   I, Dimas Chyle, MD, have reviewed all documentation for this visit. The documentation on 03/09/21 for the exam, diagnosis, procedures, and orders are all accurate and complete.  Algis Greenhouse. Jerline Pain, MD 03/09/2021 2:32 PM

## 2021-03-09 NOTE — Assessment & Plan Note (Signed)
She is on calcium and vitamin D supplementation.  Her T score is -1.6 based on DEXA scan from a couple of weeks ago.  Due to her vertebral fracture we will be starting bisphosphonate therapy.  She can repeat DEXA scan in 2 years.

## 2021-03-11 ENCOUNTER — Other Ambulatory Visit: Payer: Self-pay

## 2021-03-11 ENCOUNTER — Ambulatory Visit (INDEPENDENT_AMBULATORY_CARE_PROVIDER_SITE_OTHER)
Admission: RE | Admit: 2021-03-11 | Discharge: 2021-03-11 | Disposition: A | Discharge status: Home / Self Care | Payer: BLUE CROSS/BLUE SHIELD | Source: Ambulatory Visit | Attending: Family Medicine | Admitting: Family Medicine

## 2021-03-11 DIAGNOSIS — S32010A Wedge compression fracture of first lumbar vertebra, initial encounter for closed fracture: Secondary | ICD-10-CM | POA: Diagnosis not present

## 2021-03-11 DIAGNOSIS — M545 Low back pain, unspecified: Secondary | ICD-10-CM | POA: Diagnosis not present

## 2021-03-12 ENCOUNTER — Ambulatory Visit (HOSPITAL_BASED_OUTPATIENT_CLINIC_OR_DEPARTMENT_OTHER): Payer: Self-pay | Admitting: Physical Therapy

## 2021-03-13 ENCOUNTER — Other Ambulatory Visit: Payer: BLUE CROSS/BLUE SHIELD

## 2021-03-16 ENCOUNTER — Encounter: Payer: Self-pay | Admitting: Family Medicine

## 2021-03-16 NOTE — Progress Notes (Signed)
Please inform patient of the following:  Her x-ray is stable.  Do not need to make any changes to treatment plan at this time.

## 2021-03-18 ENCOUNTER — Ambulatory Visit (HOSPITAL_BASED_OUTPATIENT_CLINIC_OR_DEPARTMENT_OTHER): Payer: BLUE CROSS/BLUE SHIELD | Attending: Family Medicine | Admitting: Physical Therapy

## 2021-03-18 ENCOUNTER — Other Ambulatory Visit: Payer: Self-pay

## 2021-03-18 ENCOUNTER — Encounter (HOSPITAL_BASED_OUTPATIENT_CLINIC_OR_DEPARTMENT_OTHER): Payer: Self-pay | Admitting: Physical Therapy

## 2021-03-18 DIAGNOSIS — M6281 Muscle weakness (generalized): Secondary | ICD-10-CM | POA: Diagnosis not present

## 2021-03-18 DIAGNOSIS — M545 Low back pain, unspecified: Secondary | ICD-10-CM | POA: Diagnosis not present

## 2021-03-18 DIAGNOSIS — R293 Abnormal posture: Secondary | ICD-10-CM | POA: Diagnosis not present

## 2021-03-19 NOTE — Therapy (Signed)
[facility] [place], [place], [address] Phone: [phone]   Fax:  [phone]  Physical Therapy Evaluation  Patient Details  Name: Suzanne Kelley MRN: [id number] Date of Birth: Apr 30, 1956 Referring Provider (PT): Vivi Barrack, MD   Encounter Date: 03/18/2021   PT End of Session - 03/18/21 2329     Visit Number 1    Number of Visits 22    Date for PT Re-Evaluation 04/15/21    Authorization Type BCBS    Progress Note Due on Visit --   04/15/2021   PT Start Time 1435    PT Stop Time 1530    PT Time Calculation (min) 55 min    Activity Tolerance Patient tolerated treatment well    Behavior During Therapy [facility] for tasks assessed/performed             Past Medical History:  Diagnosis Date   High cholesterol    History of chicken pox    Hyperlipidemia    Uterine cancer ([place])    UTI (urinary tract infection)     Past Surgical History:  Procedure Laterality Date   ABDOMINAL HYSTERECTOMY     APPENDECTOMY      There were no vitals filed for this visit.    Subjective Assessment - 03/18/21 1445     Subjective Pt first fell in December when she fell down 5 steps when tripping over a shoe.  She had lumbar and abdominal pain and didn't seek any treatment.  Her pain improved though she continued to have mild pain.  On May 8th, she fell again when tripping over the leg of a picnic table.  Pt states on both of her falls, she was in a rush and not paying attention to where she was going.  Pt went to ED on 5/8 and had x rays.  X rays indicated moderate compression Fx of L1 and notes indicated unclear chronicity, but possibly acute.  Notes indicated her fall several months prior may have resulted in Lumbar compression Fx.  Pt saw MD on 03/09/21 and pt requested having an x-ray and PT.  MD ordered both.  X rays on 03/12/21 indicated stable appearance of L1 compression Fx from prior exam.  pt has transitional lumbosacral  anatomy.  no new abnormality.  L5-S1 facety hypertrophy with trace/grade 1 anterolisthesis, unchanged.  pt states she was informed the fx was closed up.  Pt states her pain has improved.  She takes advil but is trying to reduce usage.  Pt has increased pain with standing 10 mins.  Pain with performing sit/stand transfers which is worse without advil.  Pt is limited with her walking program and is walking 1 mile at 2.0 MPH 3x/wk.  Pt has pain and is limited with lifting.  Pt has pain and is limited with performing household chores.  Pain with lifting and carrying groceries--she takes multiple trips.    Pertinent History osteopenia in multiple sites.  x-ray indicated compression fx L1 with 40% loss of height anteriorly and trace/grade 1 anterolisthesis at L5-S1.  uterine CA in the 90's    Limitations Lifting;Standing    How long can you stand comfortably? 10 mins    Diagnostic tests X rays on 03/12/21 indicated stable appearance of L1 compression Fx from prior exam.  compression  with 40% loss of height anteriorly.  pt has transitional lumbosacral anatomy.  no new abnormality.  L5-S1 facety hypertrophy with trace/grade 1 anterolisthesis, unchanged.    Patient  Stated Goals increase walking program, to perform household cleaning with less pain    Currently in Pain? Yes    Pain Score --   3-4/10 current, 6/10 worst, 0/10 best   Pain Location --   central lumbar and just R of Lumbar SPs   Aggravating Factors  standing, lifting    Pain Relieving Factors lying down    Effect of Pain on Daily Activities limited household cleaning, lifting/carrying groceries                [facility] PT Assessment - 03/19/21 0001       Assessment   Medical Diagnosis Compression fracture of body of L1 vertebra    Referring Provider (PT) Vivi Barrack, MD    Onset Date/Surgical Date 12/14/20    Hand Dominance Left    Next MD Visit November 2022    Prior Therapy none      Precautions   Precautions --   Per dx;  osteopenia     Restrictions   Weight Bearing Restrictions No      Prior Function   Level of Independence Independent   Pt performed walking program 4-5 times/week for 3 miles at 3.0 MPH.Pt able to perform her functional lifting and carrying including groceries without difficulty or increased pain.Pt able to perform her normal standing activities without significant pain.   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  IC are level/equal height      AROM   Lumbar Flexion WFL   without pain   Lumbar Extension WFL   3-4/10 pain   Lumbar - Right Side Bend WFL    Lumbar - Left Side Bend WFL    Lumbar - Right Rotation 75%   without pain   Lumbar - Left Rotation 75%   without pain     Palpation   Palpation comment TTP:  lower lumbar SP with increased tenderness just to the R of SPs in lower lumbar.  pt with no no tenderness in upper lumbar.  pt has moderate soft tissue tightness in bilat lumbar paraspinals.      Ambulation/Gait   Gait Comments WFL, no limping                        Objective measurements completed on examination: See above findings.               PT Education - 03/18/21 2318     Education Details Educated pt in dx and relevant spinal anatomy.  Used a spinal model to educate pt.  Instructed pt in importance of not slumping over and not having rounded shoulders, but to have good erect posture including shoulders retracted and neutral spine.  Instructed pt to avoid bending.  Educated pt in [place] and answered Pt's questions.    Person(s) Educated Patient    Methods Explanation;Demonstration    Comprehension Verbalized understanding              PT Short Term Goals - 03/18/21 2332       PT SHORT TERM  GOAL #1   Title Pt will be independent and compiant with HEP for improved pain, posture, core strength, and function.  Time 4    Period Weeks    Status New    Target Date 04/15/21      PT SHORT TERM GOAL #2   Title Pt will report at least a 25% improvement in standing activities and daily mobility.    Time 4    Period Weeks    Status New    Target Date 04/15/21      PT SHORT TERM GOAL #3   Title Pt will be able to perform sit/stand transfers and car transfers without increased pain.    Time 6    Period Weeks    Status New    Target Date 04/29/21      PT SHORT TERM GOAL #4   Title Pt will demo improved posture standing erect with scapulas retracted having reduced forward shoulders for reduced stress and strain on lumbar and to promote postural alignment.    Time 6    Period Weeks    Status New    Target Date 04/29/21      PT SHORT TERM GOAL #5   Title Pt will report increased ambulation distance with reduced pain.    Time 6    Period Weeks    Status New    Target Date 04/29/21               PT Long Term Goals - 03/18/21 2333       PT LONG TERM GOAL #1   Title Pt will demo improved core strength as evidenced by progressing with core exercises without adverse effects and demo 4+ to 5/5 bilat hip flex/abd/ER and knee extension strength for improved performance and tolerance of functional mobility and to assist with returning to PLOF.    Time --   10-12   Period Weeks    Status New    Target Date 06/10/21      PT LONG TERM GOAL #2   Title Pt will be able to perform her normal standing activities including household chores without significant pain or limitation.    Time --   10-12 weeks   Target Date 06/10/21      PT LONG TERM GOAL #3   Title Pt will be able to perform her normal community amblulation without increased pain.    Time 8    Period Weeks    Status New    Target Date 05/13/21      PT LONG TERM GOAL #4   Title Pt will report she is able to  perform a walking program without significant pain.    Time --   10-12   Period Weeks    Status New    Target Date 06/10/21      PT LONG TERM GOAL #5   Title pt will be able to carry her groceries without significant pain or difficulty.    Time --   10-12   Period Weeks    Status New    Target Date 06/10/21                    Plan - 03/18/21 2321     Clinical Impression Statement Pt is a 65 y/o female with a dx of compression fracture of body of L1 vertebra and is 13 weeks and 3 days s/p her most recent fall.  Pt fell on 12/14/20 and had x rays indicateding moderate compression Fx of L1 and notes indicated unclear chronicity, but possibly acute.  She had further x rays on 03/12/21  indicating stable appearance of L1 compression Fx, transitional lumbosacral anatomy, and L5-S1 facety hypertrophy with trace/grade 1 anterolisthesis.  Pt has a hx of osteoepenia.  Pt has increased pain with standing 10 mins.  She has pain with performing sit/stand transfers which is worse without advil.  Pt typically performs a walking program though is limited with her walking program currently.  Pt has pain and is limited with performing household chores and with lifting including lifting/carrying groceries.  Pt has postural deficits including rounded shoulders and was educated in importance of correct posture.  Pt has muscle weakness as evidenced by decreased functional tolerance and performance of standing and mobility.  Pt should benefit from skilled PT services.    Personal Factors and Comorbidities Comorbidity 1    Comorbidities osteopenia in multiple sites    Examination-Activity Limitations Carry;Lift;Stand;Locomotion Level;Transfers    Examination-Participation Restrictions Cleaning    Stability/Clinical Decision Making Stable/Uncomplicated    Clinical Decision Making Low    Rehab Potential Good    PT Frequency --   1-2x / week   PT Duration --   10-12 weeks   PT Treatment/Interventions ADLs/Self  Care Home Management;Aquatic Therapy;Cryotherapy;Moist Heat;Electrical Stimulation;Functional mobility training;Therapeutic activities;Therapeutic exercise;Balance training;Neuromuscular re-education;Manual techniques;Patient/family education;Gait training;Dry needling    PT Next Visit Plan Pt states she will be unable to schedule for the next 2 weeks due to taking care of her grandaughter.  Add scap retraction and supine TrA contraction and low level core exercises per dx and as tolerated    PT Home Exercise Plan Pt not given a HEP handout, but educated in correct posture and spinal neutral position.    Consulted and Agree with Plan of Care Patient             Patient will benefit from skilled therapeutic intervention in order to improve the following deficits and impairments:  Decreased activity tolerance, Decreased mobility, Decreased endurance, Decreased strength, Difficulty walking, Impaired flexibility, Postural dysfunction  Visit Diagnosis: Low back pain without sciatica, unspecified back pain laterality, unspecified chronicity  Muscle weakness (generalized)  Abnormal posture     Problem List Patient Active Problem List   Diagnosis Date Noted   Closed compression fracture of body of L1 vertebra ([place]) 03/09/2021   Vitamin D deficiency 06/25/2020   Positive colorectal cancer screening using Cologuard test 08/20/2019   Uterine cancer ([place]) 08/01/2019   Hypercholesterolemia 05/19/2018   Osteopenia of multiple sites 05/19/2018    Selinda Michaels III PT, DPT 03/19/21 12:35 PM   [facility] [address] [place], [place], [address] Phone: [phone]   Fax:  [phone]  Name: Suzanne Kelley MRN: [id number] Date of Birth: 1955-12-25

## 2021-04-06 ENCOUNTER — Encounter (HOSPITAL_BASED_OUTPATIENT_CLINIC_OR_DEPARTMENT_OTHER): Payer: Self-pay | Admitting: Physical Therapy

## 2021-04-06 ENCOUNTER — Other Ambulatory Visit: Payer: Self-pay

## 2021-04-06 ENCOUNTER — Ambulatory Visit (HOSPITAL_BASED_OUTPATIENT_CLINIC_OR_DEPARTMENT_OTHER): Payer: BLUE CROSS/BLUE SHIELD | Admitting: Physical Therapy

## 2021-04-06 DIAGNOSIS — R293 Abnormal posture: Secondary | ICD-10-CM | POA: Diagnosis not present

## 2021-04-06 DIAGNOSIS — M6281 Muscle weakness (generalized): Secondary | ICD-10-CM

## 2021-04-06 DIAGNOSIS — M545 Low back pain, unspecified: Secondary | ICD-10-CM

## 2021-04-06 NOTE — Therapy (Addendum)
[place], [place], [address] Phone: [phone]   Fax:  [phone]  Physical Therapy Treatment  Patient Details  Name: Suzanne Kelley MRN: [id number] Date of Birth: 05-20-1956 Referring Provider (PT): Vivi Barrack, MD   Encounter Date: 04/06/2021   PT End of Session - 04/06/21 0943     Visit Number 2    Number of Visits 22    Date for PT Re-Evaluation 04/15/21    Authorization Type BCBS    PT Start Time [phone]    PT Stop Time 1018    PT Time Calculation (min) 35 min    Activity Tolerance Patient tolerated treatment well    Behavior During Therapy [facility] for tasks assessed/performed             Past Medical History:  Diagnosis Date   High cholesterol    History of chicken pox    Hyperlipidemia    Uterine cancer ([place])    UTI (urinary tract infection)     Past Surgical History:  Procedure Laterality Date   ABDOMINAL HYSTERECTOMY     APPENDECTOMY      There were no vitals filed for this visit.   Subjective Assessment - 04/06/21 0946     Subjective Pt went to ED on 5/8 and had x rays.  X rays indicated moderate compression Fx of L1 and notes indicated unclear chronicity, but possibly acute.  Notes indicated her fall several months prior may have resulted in Lumbar compression Fx.  Pt saw MD on 03/09/21 and pt requested having an x-ray and PT.  MD ordered both.  X rays on 03/12/21 indicated stable appearance of L1 compression Fx from prior exam.  pt has transitional lumbosacral anatomy.  no new abnormality.  L5-S1 facety hypertrophy with trace/grade 1 anterolisthesis, unchanged.  Pt states she felt better after prior Rx.  Pt states she listened to PT from prior Rx and reports she has not been lifting as muchand has focused on good posture and a neutral spine.  Pt states she feels much better.  Pt is 16 weeks and 1 day s/p fall.  Pt denies pain currently.  Pt states she is limited with standing  duration.  pt able to walk 1 mile on TM at 2.2 MPH.  Pt is limited with performing household chores and lifting including groceries.    Pertinent History osteopenia in multiple sites.  x-ray indicated compression fx L1 with 40% loss of height anteriorly and trace/grade 1 anterolisthesis at L5-S1.  uterine CA in the 90's    Diagnostic tests X rays on 03/12/21 indicated stable appearance of L1 compression Fx from prior exam.  compression  with 40% loss of height anteriorly.  pt has transitional lumbosacral anatomy.  no new abnormality.  L5-S1 facet hypertrophy with trace/grade 1 anterolisthesis, unchanged.    Currently in Pain? No/denies                               [facility] Adult PT Treatment/Exercise - 04/06/21 0001       Exercises   Exercises Lumbar   Reviewed response to prior Rx, current function, and pain level.  Pt received a HEP handout and was educated in correct form and appropriate frequency.  See Pt education for detailed educated provided.     Lumbar Exercises: Standing   Scapular Retraction Limitations 2x10 reps      Lumbar Exercises: Supine  Other Supine Lumbar Exercises supine TrA contraction with 5 sec hold, supine TrA contraction with heel slide 2x10 reps, supine bent knee fall out with TrA contraction 2x10 reps                    PT Education - 04/06/21 1023     Education Details Educated pt concerning correct posture, neutral spine, and relevant anatomy.  Instructed pt in proper positioning.  Instructed pt to not lie supine with legs straight out.  Instructed pt in correct performance and palpation of TrA contraction.  Pt received a HEP handout and was instructed in corretd form and appropriate frequency.  Access Code: [email or web address]  URL: [email or web address]  Date: 04/06/2021  Prepared by: Ronny Flurry    Exercises  Seated Scapular Retraction - 2 x daily - 7 x weekly - 1-2 sets - 10 reps  Supine Core Control with Heel Slide - 1-2 x daily  - 7 x weekly - 2 sets - 10 reps  Bent Knee Fallouts - 1-2 x daily - 6-7 x weekly - 2 sets - 10 reps.  Pt also received a TrA contraction handout.   Pt instructed she should not have pain with HEP and to stop if she does.    Person(s) Educated Patient    Methods Explanation;Demonstration;Verbal cues;Handout    Comprehension Returned demonstration;Verbalized understanding              PT Short Term Goals - 03/18/21 2332       PT SHORT TERM GOAL #1   Title Pt will be independent and compiant with HEP for improved pain, posture, core strength, and function.    Time 4    Period Weeks    Status New    Target Date 04/15/21      PT SHORT TERM GOAL #2   Title Pt will report at least a 25% improvement in standing activities and daily mobility.    Time 4    Period Weeks    Status New    Target Date 04/15/21      PT SHORT TERM GOAL #3   Title Pt will be able to perform sit/stand transfers and car transfers without increased pain.    Time 6    Period Weeks    Status New    Target Date 04/29/21      PT SHORT TERM GOAL #4   Title Pt will demo improved posture standing erect with scapulas retracted having reduced forward shoulders for reduced stress and strain on lumbar and to promote postural alignment.    Time 6    Period Weeks    Status New    Target Date 04/29/21      PT SHORT TERM GOAL #5   Title Pt will report increased ambulation distance with reduced pain.    Time 6    Period Weeks    Status New    Target Date 04/29/21               PT Long Term Goals - 03/18/21 2333       PT LONG TERM GOAL #1   Title Pt will demo improved core strength as evidenced by progressing with core exercises without adverse effects and demo 4+ to 5/5 bilat hip flex/abd/ER and knee extension strength for improved performance and tolerance of functional mobility and to assist with returning to PLOF.    Time --   10-12   Period Weeks    Status New  Target Date 06/10/21      PT LONG  TERM GOAL #2   Title Pt will be able to perform her normal standing activities including household chores without significant pain or limitation.    Time --   10-12 weeks   Target Date 06/10/21      PT LONG TERM GOAL #3   Title Pt will be able to perform her normal community amblulation without increased pain.    Time 8    Period Weeks    Status New    Target Date 05/13/21      PT LONG TERM GOAL #4   Title Pt will report she is able to perform a walking program without significant pain.    Time --   10-12   Period Weeks    Status New    Target Date 06/10/21      PT LONG TERM GOAL #5   Title pt will be able to carry her groceries without significant pain or difficulty.    Time --   10-12   Period Weeks    Status New    Target Date 06/10/21                   Plan - 04/06/21 1328     Clinical Impression Statement Pt is 16 weeks and 1 day s/p prior fall and reports she is doing much better.  Pt reports she took PT's advice and is feeling much better.  She has been more aware of her posture and not lifting certain things.  PT provided education today concerning TrA contraction/core engagement, proper positioning, and appropriate posture.  Added gentle core and postural exercises today and Pt performed exercises well with instruction and correct form.  Pt reports having some discomfort lying on back which felt better with obtaining a hooklying position not having legs straightout.  Pt responded well to Rx having reporting minimally increased pain which pt states to be a 2/10.   Pt didn't have pain with performing exercises though thinks it's from lying on her back.  Pt should benefit from cont skilled PT services to address ongoing goals and assist with returning pt to PLOF.    Comorbidities osteopenia in multiple sites    PT Treatment/Interventions ADLs/Self Care Home Management;Aquatic Therapy;Cryotherapy;Moist Heat;Electrical Stimulation;Functional mobility training;Therapeutic  activities;Therapeutic exercise;Balance training;Neuromuscular re-education;Manual techniques;Patient/family education;Gait training;Dry needling    PT Next Visit Plan Cont 1x/wk.  Cont with core and postural exercises per dx and pt tolerance.    PT Home Exercise Plan Access Code: [email or web address]  URL: [email or web address]  Date: 04/06/2021  Prepared by: Ronny Flurry    Exercises  Seated Scapular Retraction - 2 x daily - 7 x weekly - 1-2 sets - 10 reps  Supine Core Control with Heel Slide - 1-2 x daily - 7 x weekly - 2 sets - 10 reps  Bent Knee Fallouts - 1-2 x daily - 6-7 x weekly - 2 sets - 10 reps.    Consulted and Agree with Plan of Care Patient             Patient will benefit from skilled therapeutic intervention in order to improve the following deficits and impairments:  Decreased activity tolerance, Decreased mobility, Decreased endurance, Decreased strength, Difficulty walking, Impaired flexibility, Postural dysfunction  Visit Diagnosis: Low back pain without sciatica, unspecified back pain laterality, unspecified chronicity  Muscle weakness (generalized)  Abnormal posture     Problem List Patient Active Problem List   Diagnosis Date Noted  Closed compression fracture of body of L1 vertebra ([facility]) 03/09/2021   Vitamin D deficiency 06/25/2020   Positive colorectal cancer screening using Cologuard test 08/20/2019   Uterine cancer ([place]) 08/01/2019   Hypercholesterolemia 05/19/2018   Osteopenia of multiple sites 05/19/2018    Selinda Michaels III PT, DPT 04/06/21 10:15 PM   [facility] [place], [place], [address] Phone: [phone]   Fax:  [phone]  Name: FIORE KUTZKE MRN: [id number] Date of Birth: 1956-03-14

## 2021-04-15 ENCOUNTER — Other Ambulatory Visit: Payer: Self-pay

## 2021-04-15 ENCOUNTER — Ambulatory Visit (HOSPITAL_BASED_OUTPATIENT_CLINIC_OR_DEPARTMENT_OTHER): Payer: BLUE CROSS/BLUE SHIELD | Attending: Family Medicine | Admitting: Physical Therapy

## 2021-04-15 ENCOUNTER — Encounter (HOSPITAL_BASED_OUTPATIENT_CLINIC_OR_DEPARTMENT_OTHER): Payer: Self-pay | Admitting: Physical Therapy

## 2021-04-15 DIAGNOSIS — M6281 Muscle weakness (generalized): Secondary | ICD-10-CM | POA: Insufficient documentation

## 2021-04-15 DIAGNOSIS — M545 Low back pain, unspecified: Secondary | ICD-10-CM | POA: Diagnosis not present

## 2021-04-15 DIAGNOSIS — R293 Abnormal posture: Secondary | ICD-10-CM | POA: Insufficient documentation

## 2021-04-15 NOTE — Therapy (Signed)
[place], [place], [address] Phone: [phone]   Fax:  [phone]  Physical Therapy Treatment  Patient Details  Name: Suzanne Kelley MRN: [id number] Date of Birth: 1956/07/18 Referring Provider (PT): Vivi Barrack, MD   Encounter Date: 04/15/2021   PT End of Session - 04/15/21 1039     Visit Number 3    Number of Visits 22    Date for PT Re-Evaluation 04/15/21    Authorization Type BCBS    Progress Note Due on Visit --   04/15/2021   PT Start Time 1027    PT Stop Time 1107    PT Time Calculation (min) 40 min    Activity Tolerance Patient tolerated treatment well    Behavior During Therapy [facility] for tasks assessed/performed             Past Medical History:  Diagnosis Date   High cholesterol    History of chicken pox    Hyperlipidemia    Uterine cancer ([employer])    UTI (urinary tract infection)     Past Surgical History:  Procedure Laterality Date   ABDOMINAL HYSTERECTOMY     APPENDECTOMY      There were no vitals filed for this visit.   Subjective Assessment - 04/15/21 1029     Subjective Pt went to ED on 5/8 and had x rays.  X rays indicated moderate compression Fx of L1 and notes indicated unclear chronicity, but possibly acute.  Notes indicated her fall several months prior may have resulted in Lumbar compression Fx.  Pt saw MD on 03/09/21 and pt requested having an x-ray and PT.  MD ordered both.  X rays on 03/12/21 indicated stable appearance of L1 compression Fx from prior exam.  pt has transitional lumbosacral anatomy.  no new abnormality.  L5-S1 facety hypertrophy with trace/grade 1 anterolisthesis, unchanged.  Pt states she has a kidney stone which was found in May after her fall.  Pt is 17 weeks and 3 days s/p fall.  Pt is limited with performing household chores and lifting including groceries.  Pt is compliant with HEP and states she feels good with the exercises.  Pt states she is able  to stand longer and she thinks that has improved from the exercises.  Pt states she was doing well until she walked on TM 2 days consecutively for 30 mins each time.  Pt had increased pain on Monday.  She rested yesterday and took Ibuprofen and feels much better today.  Pt states PT is helping.  [facility] without increased pain.    Pertinent History osteopenia in multiple sites.  x-ray indicated compression fx L1 with 40% loss of height anteriorly and trace/grade 1 anterolisthesis at L5-S1.  uterine CA in the 90's    Patient Stated Goals increase walking program, to perform household cleaning with less pain.  To drive her grandchildren around.    Currently in Pain? Yes    Pain Location --   R sided lumbar, close to spine, but not on spine.                              [facility] PT Treatment/Exercise - 04/15/21 0001       Exercises   Exercises Lumbar   Reviewed response to prior Rx, current function, and pain level.  Pt received a HEP  handout and was educated in correct form and appropriate frequency.  See Pt education for detailed educated provided.     Lumbar Exercises: Standing   Row Limitations 3x10 with YTB with retraction    Other Standing Lumbar Exercises Standing on [employer] with FA and FT 2x30 sec each and with EC 2x15 sec.      Lumbar Exercises: Supine   Other Supine Lumbar Exercises q-ped TrA contraction with 5 sec hold approx 15 reps, supine TrA contraction with heel slide x10 reps, supine bent knee fall out with TrA contraction x10 reps      Manual Therapy   Manual Therapy Soft tissue mobilization    Soft tissue mobilization to bilat lumbar paraspinals and erector spinae in prone to improve soft tissue mobility, tightness, and pain                     PT Education - 04/15/21 1052     Education Details educated pt concerning walking  program.  Instructed pt in the importance of a walking program though  she has to modify her walking depening on her sx's and response.  Informed she may have had pain due to walking consecutive days and she may need rest in between or it could be due to the surface of the TM.  Answered Pt's questions.  Updated HEP and gave pt a HEP handout.  Access Code: [email or web address] URL: [email or web address] Date: 04/15/2021 Prepared by: Ronny Flurry Exercises Supine Bridge - 1 x daily - 5 x weekly - 2 sets - 10 reps Standing Row with Anchored Resistance - 1 x daily - 3 x weekly - 2-3 sets - 10 reps    Person(s) Educated Patient    Methods Explanation;Demonstration;Verbal cues;Handout    Comprehension Verbalized understanding;Returned demonstration              PT Short Term Goals - 03/18/21 2332       PT SHORT TERM GOAL #1   Title Pt will be independent and compiant with HEP for improved pain, posture, core strength, and function.    Time 4    Period Weeks    Status New    Target Date 04/15/21      PT SHORT TERM GOAL #2   Title Pt will report at least a 25% improvement in standing activities and daily mobility.    Time 4    Period Weeks    Status New    Target Date 04/15/21      PT SHORT TERM GOAL #3   Title Pt will be able to perform sit/stand transfers and car transfers without increased pain.    Time 6    Period Weeks    Status New    Target Date 04/29/21      PT SHORT TERM GOAL #4   Title Pt will demo improved posture standing erect with scapulas retracted having reduced forward shoulders for reduced stress and strain on lumbar and to promote postural alignment.    Time 6    Period Weeks    Status New    Target Date 04/29/21      PT SHORT TERM GOAL #5   Title Pt will report increased ambulation distance with reduced pain.    Time 6    Period Weeks    Status New    Target Date 04/29/21               PT Long Term Goals - 03/18/21 2333  PT LONG TERM GOAL #1   Title Pt will demo improved core strength as evidenced by  progressing with core exercises without adverse effects and demo 4+ to 5/5 bilat hip flex/abd/ER and knee extension strength for improved performance and tolerance of functional mobility and to assist with returning to PLOF.    Time --   10-12   Period Weeks    Status New    Target Date 06/10/21      PT LONG TERM GOAL #2   Title Pt will be able to perform her normal standing activities including household chores without significant pain or limitation.    Time --   10-12 weeks   Target Date 06/10/21      PT LONG TERM GOAL #3   Title Pt will be able to perform her normal community amblulation without increased pain.    Time 8    Period Weeks    Status New    Target Date 05/13/21      PT LONG TERM GOAL #4   Title Pt will report she is able to perform a walking program without significant pain.    Time --   10-12   Period Weeks    Status New    Target Date 06/10/21      PT LONG TERM GOAL #5   Title pt will be able to carry her groceries without significant pain or difficulty.    Time --   10-12   Period Weeks    Status New    Target Date 06/10/21                   Plan - 04/15/21 1103     Clinical Impression Statement Pt is progressing well with pain, sx's, and function.  She states PT is helping and she is feeling much better.  She responded well to [employer] stating she felt good after STM and states that "really helped".  PT progressed exercises today and Pt performed exercises well without pain or c/o's.  PT updated HEP and gave pt a HEP handout.  Pt responded well to Rx stating she felt better after Rx than when she came in.  Pt should benefit from cont skilled PT services to address ongoing goals and to restore PLOF.    Comorbidities osteopenia in multiple sites    PT Treatment/Interventions ADLs/Self Care Home Management;Aquatic Therapy;Cryotherapy;Moist Heat;Electrical Stimulation;Functional mobility training;Therapeutic activities;Therapeutic exercise;Balance  training;Neuromuscular re-education;Manual techniques;Patient/family education;Gait training;Dry needling    PT Next Visit Plan Cont 1x/wk.  Cont with core and postural exercises per dx and pt tolerance.    PT Home Exercise Plan Access Code: [email or web address]  URL: [email or web address]  Date: 04/15/2021  Prepared by: Ronny Flurry    Exercises   Supine Bridge - 1 x daily - 5 x weekly - 2 sets - 10 reps  Standing Row with Anchored Resistance - 1 x daily - 3 x weekly - 2-3 sets - 10 reps    Consulted and Agree with Plan of Care Patient             Patient will benefit from skilled therapeutic intervention in order to improve the following deficits and impairments:  Decreased activity tolerance, Decreased mobility, Decreased endurance, Decreased strength, Difficulty walking, Impaired flexibility, Postural dysfunction  Visit Diagnosis: Low back pain without sciatica, unspecified back pain laterality, unspecified chronicity  Muscle weakness (generalized)  Abnormal posture     Problem List Patient Active Problem List   Diagnosis Date Noted   Closed  compression fracture of body of L1 vertebra (HCC) 03/09/2021   Vitamin D deficiency 06/25/2020   Positive colorectal cancer screening using Cologuard test 08/20/2019   Uterine cancer ([place]) 08/01/2019   Hypercholesterolemia 05/19/2018   Osteopenia of multiple sites 05/19/2018    Selinda Michaels III PT, DPT 04/15/21 10:17 PM   [facility] [place], [place], [address] Phone: [phone]   Fax:  [phone]  Name: Suzanne Kelley MRN: [id number] Date of Birth: [birth year]

## 2021-04-22 ENCOUNTER — Encounter (HOSPITAL_BASED_OUTPATIENT_CLINIC_OR_DEPARTMENT_OTHER): Payer: Self-pay | Admitting: Physical Therapy

## 2021-04-22 ENCOUNTER — Ambulatory Visit (HOSPITAL_BASED_OUTPATIENT_CLINIC_OR_DEPARTMENT_OTHER): Payer: BLUE CROSS/BLUE SHIELD | Admitting: Physical Therapy

## 2021-04-22 ENCOUNTER — Other Ambulatory Visit: Payer: BLUE CROSS/BLUE SHIELD

## 2021-04-22 ENCOUNTER — Other Ambulatory Visit: Payer: Self-pay

## 2021-04-22 DIAGNOSIS — R293 Abnormal posture: Secondary | ICD-10-CM

## 2021-04-22 DIAGNOSIS — M6281 Muscle weakness (generalized): Secondary | ICD-10-CM | POA: Diagnosis not present

## 2021-04-22 DIAGNOSIS — M545 Low back pain, unspecified: Secondary | ICD-10-CM

## 2021-04-23 NOTE — Therapy (Signed)
[place], [place], [address] Phone: [phone]   Fax:  [phone]  Physical Therapy Treatment/Progress Note  Progress Note Reporting Period 03/18/2021 to 04/22/2021  See note below for Objective Data and Assessment of Progress/Goals.     Patient Details  Name: Suzanne Kelley MRN: [id number] Date of Birth: Oct 05, 1955 Referring Provider (PT): Vivi Barrack, MD   Encounter Date: 04/22/2021   PT End of Session - 04/22/21 1023     Visit Number 4    Number of Visits 22    Date for PT Re-Evaluation 06/10/21    Authorization Type BCBS    Progress Note Due on Visit --   05/20/2021   PT Start Time 1020    PT Stop Time 1102    PT Time Calculation (min) 42 min    Activity Tolerance Patient tolerated treatment well    Behavior During Therapy WFL for tasks assessed/performed             Past Medical History:  Diagnosis Date   High cholesterol    History of chicken pox    Hyperlipidemia    Uterine cancer ([place])    UTI (urinary tract infection)     Past Surgical History:  Procedure Laterality Date   ABDOMINAL HYSTERECTOMY     APPENDECTOMY      There were no vitals filed for this visit.   Subjective Assessment - 04/22/21 1023     Subjective Pt went to ED on 5/8 and had x rays.  X rays indicated moderate compression Fx of L1 and notes indicated unclear chronicity, but possibly acute.  Notes indicated her fall several months prior may have resulted in Lumbar compression Fx.  X rays on 03/12/21 indicated stable appearance of L1 compression Fx from prior exam.  pt has transitional lumbosacral anatomy.  no new abnormality.  L5-S1 facety hypertrophy with trace/grade 1 anterolisthesis, unchanged.  Pt states she has a kidney stone which was found in May after her fall.  Pt is 18 weeks and 3 days s/p fall.  Pt states she is able to stand longer and she thinks that has improved from the exercises.   Pt states  PT is helping.  [facility] without increased pain.  Pt denies any adverse effects after prior Rx.  Pt reports compliance and tolerance of HEP.  She states the exercises are firming her up and she likes them.  Pt states she was doing great until she lifted the trash bag over the weekend.  Pt reports she bent over to lift trash bag and still has some central lumbar pain today.  Pt reports improved walking and standing.   Pt states she feels better after PT.  Pt is limited with lifting including groceries.  Her main c/o is lifting.  "I never had pain free days, until I came here".    Pertinent History osteopenia in multiple sites.  x-ray indicated compression fx L1 with 40% loss of height anteriorly and trace/grade 1 anterolisthesis at L5-S1.  uterine CA in the 90's    Limitations Lifting    Diagnostic tests X rays on 03/12/21 indicated stable appearance of L1 compression Fx from prior exam.  compression  with 40% loss of height anteriorly.  pt has transitional lumbosacral anatomy.  no new abnormality.  L5-S1 facet hypertrophy with trace/grade 1 anterolisthesis, unchanged.    Currently in Pain? Yes    Pain Score --  3-4/10 current.  3-4/10 worst.  0/10 best   Pain Location --   central lower lumbar               [facility] PT Assessment - 04/23/21 0001       Assessment   Medical Diagnosis Compression fracture of body of L1 vertebra    Referring Provider (PT) Vivi Barrack, MD      Posture/Postural Control   Posture Comments Improved posture      AROM   Lumbar - Right Rotation [facility]   No pain   Lumbar - Left Rotation 75%   without pain     Palpation   Palpation comment TTP:  no tenderness in palpating lumbar SP's or lumbar paraspinals.  pt has moderate soft tissue tightness in bilat lumbar paraspinals.      Ambulation/Gait   Gait Comments WFL, no limping                           [facility] Adult PT Treatment/Exercise - 04/23/21 0001        Exercises   Exercises Lumbar   Reviewed response to prior Rx, current function, and pain level.  Assessed goals, lumbar rotation AROM, gait, tenderness to palpation, and posture.     Lumbar Exercises: Standing   Row Limitations 3x10 with YTB    Other Standing Lumbar Exercises low rows with YTB with retraction 2x10 reps      Lumbar Exercises: Supine   Other Supine Lumbar Exercises q-ped TrA contraction with 5 sec hold approx 15 reps, supine bridge 2x10 reps (which Pt performed last Rx)      Manual Therapy   Manual Therapy Soft tissue mobilization    Soft tissue mobilization to bilat lumbar paraspinals and erector spinae in prone to improve soft tissue mobility, tightness, and pain                     PT Education - 04/22/21 2348     Education Details Educated pt concerning goal progress and POC.  Instructed pt in correct body mechanics with lifting.  Instructed pt to not bend over to lift but to perform squat to lift.  Instruced pt she should limit her lifting and not lift anything heavy. Instructed her to speak to MD concerning any lifting restrictions.    Person(s) Educated Patient    Methods Explanation    Comprehension Verbalized understanding              PT Short Term Goals - 04/22/21 1031       PT SHORT TERM GOAL #1   Title Pt will be independent and compiant with HEP for improved pain, posture, core strength, and function.    Time 4    Period Weeks    Status Achieved    Target Date 04/15/21      PT SHORT TERM GOAL #2   Title Pt will report at least a 25% improvement in standing activities and daily mobility.    Baseline Pt reports 50% improvement    Time 4    Period Weeks    Status Achieved    Target Date 04/15/21      PT SHORT TERM GOAL #3   Title Pt will be able to perform sit/stand transfers and car transfers without increased pain.    Time 6    Period Weeks    Status Achieved    Target Date 04/29/21  PT SHORT TERM GOAL #4    Title Pt will demo improved posture standing erect with scapulas retracted having reduced forward shoulders for reduced stress and strain on lumbar and to promote postural alignment.    Time 6    Period Weeks    Status Partially Met    Target Date 04/29/21      PT SHORT TERM GOAL #5   Title Pt will report increased ambulation distance with reduced pain.    Time 6    Period Weeks    Status Achieved    Target Date 04/29/21               PT Long Term Goals - 03/18/21 2333       PT LONG TERM GOAL #1   Title Pt will demo improved core strength as evidenced by progressing with core exercises without adverse effects and demo 4+ to 5/5 bilat hip flex/abd/ER and knee extension strength for improved performance and tolerance of functional mobility and to assist with returning to PLOF.    Time --   10-12   Period Weeks    Status New    Target Date 06/10/21      PT LONG TERM GOAL #2   Title Pt will be able to perform her normal standing activities including household chores without significant pain or limitation.    Time --   10-12 weeks   Target Date 06/10/21      PT LONG TERM GOAL #3   Title Pt will be able to perform her normal community amblulation without increased pain.    Time 8    Period Weeks    Status New    Target Date 05/13/21      PT LONG TERM GOAL #4   Title Pt will report she is able to perform a walking program without significant pain.    Time --   10-12   Period Weeks    Status New    Target Date 06/10/21      PT LONG TERM GOAL #5   Title pt will be able to carry her groceries without significant pain or difficulty.    Time --   10-12   Period Weeks    Status New    Target Date 06/10/21                   Plan - 04/22/21 1059     Clinical Impression Statement Pt is progressing well with pain, tolerance to activity, function, and toward goals.  Pt is very appreciative of skilled PT services and reports improved sx's after PT and with HEP.  Pt  reports improved standing and walking and is increasing her ambulation distance.  Pt has expected limitations with lifiting including lifting groceries.  Pt demonstrates improved R lumbar rotation ROM and no change with L rotation. Pt had no tenderness with lumbar palpation though still has moderate soft tissue tightness in lumbar paraspinals.  Pt states she feels so much better, "100% better" after performing bridges and receiving STM.     Pt has met all STG's except partially meeting STG#4.  Pt should cont to beneft from cont skilled PT services to address ongoing goals and to restore PLOF.    Comorbidities osteopenia in multiple sites    PT Frequency --   1-2x/wk   PT Duration --   5-7 weeks   PT Treatment/Interventions ADLs/Self Care Home Management;Aquatic Therapy;Cryotherapy;Moist Heat;Electrical Stimulation;Functional mobility training;Therapeutic activities;Therapeutic exercise;Balance training;Neuromuscular re-education;Manual techniques;Patient/family education;Gait  training;Dry needling    PT Next Visit Plan Cont 1-2x/wk.  Cont with STM and core and postural exercises per dx and pt tolerance.    PT Home Exercise Plan Access Code: [email or web address]  URL: [email or web address]  Date: 04/15/2021  Prepared by: Ronny Flurry    Exercises   Supine Bridge - 1 x daily - 5 x weekly - 2 sets - 10 reps  Standing Row with Anchored Resistance - 1 x daily - 3 x weekly - 2-3 sets - 10 reps    Consulted and Agree with Plan of Care Patient             Patient will benefit from skilled therapeutic intervention in order to improve the following deficits and impairments:  Decreased activity tolerance, Decreased mobility, Decreased endurance, Decreased strength, Difficulty walking, Impaired flexibility, Postural dysfunction  Visit Diagnosis: Low back pain without sciatica, unspecified back pain laterality, unspecified chronicity  Muscle weakness (generalized)  Abnormal posture     Problem  List Patient Active Problem List   Diagnosis Date Noted   Closed compression fracture of body of L1 vertebra ([place]) 03/09/2021   Vitamin D deficiency 06/25/2020   Positive colorectal cancer screening using Cologuard test 08/20/2019   Uterine cancer ([place]) 08/01/2019   Hypercholesterolemia 05/19/2018   Osteopenia of multiple sites 05/19/2018    Selinda Michaels III PT, DPT 04/23/21 7:11 AM   [facility] [address] [place], [place], [address] Phone: [phone]   Fax:  [phone]  Name: Suzanne Kelley MRN: [id number] Date of Birth: [birth year]

## 2021-04-25 ENCOUNTER — Encounter: Payer: Self-pay | Admitting: Family Medicine

## 2021-04-29 ENCOUNTER — Ambulatory Visit (HOSPITAL_BASED_OUTPATIENT_CLINIC_OR_DEPARTMENT_OTHER): Payer: BLUE CROSS/BLUE SHIELD | Admitting: Physical Therapy

## 2021-04-29 ENCOUNTER — Other Ambulatory Visit: Payer: Self-pay

## 2021-04-29 ENCOUNTER — Encounter (HOSPITAL_BASED_OUTPATIENT_CLINIC_OR_DEPARTMENT_OTHER): Payer: Self-pay | Admitting: Physical Therapy

## 2021-04-29 DIAGNOSIS — M545 Low back pain, unspecified: Secondary | ICD-10-CM

## 2021-04-29 DIAGNOSIS — R293 Abnormal posture: Secondary | ICD-10-CM

## 2021-04-29 DIAGNOSIS — M6281 Muscle weakness (generalized): Secondary | ICD-10-CM

## 2021-04-29 NOTE — Therapy (Signed)
[place], [place], [address] Phone: [phone]   Fax:  [phone]  Physical Therapy Treatment  Patient Details  Name: Suzanne Kelley MRN: [id number] Date of Birth: [birth year] Referring Provider (PT): Vivi Barrack, MD   Encounter Date: 04/29/2021   PT End of Session - 04/29/21 1024     Visit Number 5    Number of Visits 22    Date for PT Re-Evaluation 06/10/21    Authorization Type BCBS    Progress Note Due on Visit --   05/22/21   PT Start Time 1022    PT Stop Time 1107    PT Time Calculation (min) 45 min    Activity Tolerance Patient tolerated treatment well    Behavior During Therapy [facility] for tasks assessed/performed             Past Medical History:  Diagnosis Date   High cholesterol    History of chicken pox    Hyperlipidemia    Uterine cancer ([place])    UTI (urinary tract infection)     Past Surgical History:  Procedure Laterality Date   ABDOMINAL HYSTERECTOMY     APPENDECTOMY      There were no vitals filed for this visit.   Subjective Assessment - 04/29/21 1022     Subjective Pt is 19 weeks and 3 days s/p fall/compression Fx.  Pt denies any adverse effects after prior Rx.  Pt states "I think it's helping".  Pt reports she is losing inches from doing the exercises and the exercises are really helping.  Pt reports much improved pain.  Pt was able to stand 30 mins without pain.  Pt states when she first started she could only stand 5 mins.    Pt states she didn't walk on TM, and feels better.  Pt has been walking in the mall and in stores and is walking 250-300 more steps than she was last week.  Pt states she is ambulating approx 5,500 steps per day.  She can tell her stamina is not as good as it used to be.  Pt has reduced achiness with standing up.    Pertinent History osteopenia in multiple sites.  x-ray indicated compression fx L1 with 40% loss of height anteriorly and  trace/grade 1 anterolisthesis at L5-S1.  uterine CA in the 90's    Diagnostic tests X rays on 03/12/21 indicated stable appearance of L1 compression Fx from prior exam.  compression  with 40% loss of height anteriorly.  pt has transitional lumbosacral anatomy.  no new abnormality.  L5-S1 facet hypertrophy with trace/grade 1 anterolisthesis, unchanged.    Currently in Pain? Yes    Pain Score --   1-2/10   Pain Location --   central and just to the R along TP                              [facility] Adult PT Treatment/Exercise - 04/29/21 0001       Neuro Re-ed    Neuro Re-ed Details  standing marching on airex with UE assist 2x10 reps, standing on airex with FT 2x30", standing with FA with EC 2x15"   Also see flow sheet for T band scapular exercises to promote improved postural strength and stability.     Exercises   Exercises Lumbar   Answered Pt's questions.  Reviewed response to prior Rx, current function, and pain level.  Educated pt concerning POC.     Lumbar Exercises: Standing   Row Limitations 3x10 with RTB    Other Standing Lumbar Exercises low rows with RTB with retraction 3x10 reps      Lumbar Exercises: Supine   Other Supine Lumbar Exercises q-ped TrA contraction with 5 sec hold x 15 reps, supine bridge 3x10 reps, supine alt LE ext with TrA 2x10 reps, supine clams with TrA contraction with RTB 2x10 reps      Manual Therapy   Manual Therapy Soft tissue mobilization    Soft tissue mobilization to bilat lumbar paraspinals and erector spinae in prone to improve soft tissue mobility, tightness, and pain                     PT Education - 04/29/21 1121     Education Details Answered Pt's questions.  Educated pt concerning POC.  Educated pt in relevant spinal anatomy and used a spinal model.  Updated HEP and gave pt a HEP handout.  Educated pt in correct form and appropriate frequency.  Access Code: [email or web address]  URL: [email or web address]  Date:  04/29/2021  Prepared by: Ronny Flurry    Exercises Hooklying Clamshell with Resistance - 1 x daily - 3 x weekly - 2-3 sets - 10 reps  Shoulder extension with resistance - Neutral - 1 x daily - 3 x weekly - 2-3 sets - 10 reps    Person(s) Educated Patient    Methods Explanation;Demonstration;Verbal cues;Handout    Comprehension Verbalized understanding;Returned demonstration              PT Short Term Goals - 04/22/21 1031       PT SHORT TERM GOAL #1   Title Pt will be independent and compiant with HEP for improved pain, posture, core strength, and function.    Time 4    Period Weeks    Status Achieved    Target Date 04/15/21      PT SHORT TERM GOAL #2   Title Pt will report at least a 25% improvement in standing activities and daily mobility.    Baseline Pt reports 50% improvement    Time 4    Period Weeks    Status Achieved    Target Date 04/15/21      PT SHORT TERM GOAL #3   Title Pt will be able to perform sit/stand transfers and car transfers without increased pain.    Time 6    Period Weeks    Status Achieved    Target Date 04/29/21      PT SHORT TERM GOAL #4   Title Pt will demo improved posture standing erect with scapulas retracted having reduced forward shoulders for reduced stress and strain on lumbar and to promote postural alignment.    Time 6    Period Weeks    Status Partially Met    Target Date 04/29/21      PT SHORT TERM GOAL #5   Title Pt will report increased ambulation distance with reduced pain.    Time 6    Period Weeks    Status Achieved    Target Date 04/29/21               PT Long Term Goals - 03/18/21 2333       PT LONG TERM GOAL #1   Title Pt will demo improved core strength as evidenced by progressing with core exercises without adverse effects and demo 4+ to 5/5 bilat hip flex/abd/ER  and knee extension strength for improved performance and tolerance of functional mobility and to assist with returning to PLOF.    Time --    10-12   Period Weeks    Status New    Target Date 06/10/21      PT LONG TERM GOAL #2   Title Pt will be able to perform her normal standing activities including household chores without significant pain or limitation.    Time --   10-12 weeks   Target Date 06/10/21      PT LONG TERM GOAL #3   Title Pt will be able to perform her normal community amblulation without increased pain.    Time 8    Period Weeks    Status New    Target Date 05/13/21      PT LONG TERM GOAL #4   Title Pt will report she is able to perform a walking program without significant pain.    Time --   10-12   Period Weeks    Status New    Target Date 06/10/21      PT LONG TERM GOAL #5   Title pt will be able to carry her groceries without significant pain or difficulty.    Time --   10-12   Period Weeks    Status New    Target Date 06/10/21                   Plan - 04/29/21 1031     Clinical Impression Statement Pt is progressing well with pain, tolerance to activity, function, and toward goals.  Pt reports she is able to stand longer duration without pain and is increasing her ambulation distance.  Pt states her pain is much better than when she started and she also has reduced achiness with standing.  Pt requires cuing to slow down and control certain exercises including bridging and T band shoulder extension.  Progressed exercises today and pt performed well without c/o's.  Updated HEP.  Pt responded well to Rx reporting improved pain and stated she felt better after Rx.  Pt should cont to beneft from cont skilled PT services to address ongoing goals and to restore PLOF.    Comorbidities osteopenia in multiple sites    PT Treatment/Interventions ADLs/Self Care Home Management;Aquatic Therapy;Cryotherapy;Moist Heat;Electrical Stimulation;Functional mobility training;Therapeutic activities;Therapeutic exercise;Balance training;Neuromuscular re-education;Manual techniques;Patient/family education;Gait  training;Dry needling    PT Next Visit Plan Cont 1-2x/wk.  Cont with STM and core and postural exercises per dx and pt tolerance.    PT Home Exercise Plan Access Code: [email or web address]  Updated HEP    Consulted and Agree with Plan of Care Patient             Patient will benefit from skilled therapeutic intervention in order to improve the following deficits and impairments:  Decreased activity tolerance, Decreased mobility, Decreased endurance, Decreased strength, Difficulty walking, Impaired flexibility, Postural dysfunction  Visit Diagnosis: Low back pain without sciatica, unspecified back pain laterality, unspecified chronicity  Muscle weakness (generalized)  Abnormal posture     Problem List Patient Active Problem List   Diagnosis Date Noted   Closed compression fracture of body of L1 vertebra ([place]) 03/09/2021   Vitamin D deficiency 06/25/2020   Positive colorectal cancer screening using Cologuard test 08/20/2019   Uterine cancer ([place]) 08/01/2019   Hypercholesterolemia 05/19/2018   Osteopenia of multiple sites 05/19/2018    Selinda Michaels III PT, DPT 04/29/21 11:37 AM   [facility] [place], [place], [address] Phone: [phone]   Fax:  [phone]  Name: Suzanne Kelley MRN: [id number] Date of Birth: 02-12-1956

## 2021-05-06 ENCOUNTER — Encounter (HOSPITAL_BASED_OUTPATIENT_CLINIC_OR_DEPARTMENT_OTHER): Payer: Self-pay | Admitting: Physical Therapy

## 2021-05-06 ENCOUNTER — Ambulatory Visit (HOSPITAL_BASED_OUTPATIENT_CLINIC_OR_DEPARTMENT_OTHER): Payer: BLUE CROSS/BLUE SHIELD | Admitting: Physical Therapy

## 2021-05-06 ENCOUNTER — Other Ambulatory Visit: Payer: Self-pay

## 2021-05-06 DIAGNOSIS — M6281 Muscle weakness (generalized): Secondary | ICD-10-CM | POA: Diagnosis not present

## 2021-05-06 DIAGNOSIS — R293 Abnormal posture: Secondary | ICD-10-CM

## 2021-05-06 DIAGNOSIS — M545 Low back pain, unspecified: Secondary | ICD-10-CM | POA: Diagnosis not present

## 2021-05-06 NOTE — Therapy (Signed)
[facility] [address] [place], [place], [address] Phone: [phone]   Fax:  [phone]  Physical Therapy Treatment  Patient Details  Name: Suzanne Kelley MRN: [id number] Date of Birth: 1956/02/21 Referring Provider (PT): Ardith Dark, MD   Encounter Date: 05/06/2021   PT End of Session - 05/06/21 1032     Visit Number 6    Number of Visits 22    Date for PT Re-Evaluation 06/10/21    Authorization Type BCBS    Progress Note Due on Visit --   05/22/2021   PT Start Time 1021    PT Stop Time 1105    PT Time Calculation (min) 44 min    Activity Tolerance Patient tolerated treatment well    Behavior During Therapy [facility] for tasks assessed/performed             Past Medical History:  Diagnosis Date   High cholesterol    History of chicken pox    Hyperlipidemia    Uterine cancer ([facility])    UTI (urinary tract infection)     Past Surgical History:  Procedure Laterality Date   ABDOMINAL HYSTERECTOMY     APPENDECTOMY      There were no vitals filed for this visit.   Subjective Assessment - 05/06/21 1022     Subjective Pt denies any adverse effects after prior Rx.  Pt reports much improved pain.  Pt was able to stand 30 mins without pain.  Pt states when she first started she could only stand 5 mins   Pt has been walking in the mall and in stores.  Pt is increasing ambulation distance and states she ambulated 5,995 steps on average per day last week (was 5,500 steps the week before).   Pt has reduced achiness with standing up.  Pt states she can take Advil and lay down and the pain goes away.  She states the pain she was feeling last week in her R sided lumbar has disappeared.  Pt performs she overdid it with the red theraband and performed more than the recommended frequency.  Pt states she is having pain though feels muscular.  Pt states she walked on the TM yesterday also which could contribute to her pain.     Pertinent History osteopenia in multiple sites.  x-ray indicated compression fx L1 with 40% loss of height anteriorly and trace/grade 1 anterolisthesis at L5-S1.  uterine CA in the 90's    Diagnostic tests X rays on 03/12/21 indicated stable appearance of L1 compression Fx from prior exam.  compression  with 40% loss of height anteriorly.  pt has transitional lumbosacral anatomy.  no new abnormality.  L5-S1 facet hypertrophy with trace/grade 1 anterolisthesis, unchanged.    Currently in Pain? Yes    Pain Score 3     Pain Location --   bilat lumbar flanks                              [facility] Adult PT Treatment/Exercise - 05/06/21 0001       Neuro Re-ed    Neuro Re-ed Details  standing marching on airex with UE assist 3x10 reps, standing on airex with FT 2x30", standing with FA with EC 2x15", tandem gait x 4 reps with 1 UE assist   Also see flow sheet for T band scapular exercises to promote improved postural strength and stability.     Exercises  Exercises Lumbar   Answered Pt's questions.  Reviewed response to prior Rx, current function, and pain level.     Lumbar Exercises: Supine   Other Supine Lumbar Exercises q-ped TrA contraction with 5 sec hold x 15 reps, supine bridge 2 x 10 reps, supine alt LE ext with TrA 2x10 reps, supine clams with TrA contraction with RTB 2x10 reps      Manual Therapy   Manual Therapy Soft tissue mobilization    Soft tissue mobilization to bilat lumbar paraspinals and erector spinae in prone to improve soft tissue mobility, tightness, and pain                     PT Education - 05/06/21 2138     Education Details Answered pt's questions.  Educated pt concerning sx management and appropriate frequency of home exercises.    Person(s) Educated Patient    Methods Explanation;Demonstration;Verbal cues    Comprehension Verbalized understanding;Returned demonstration              PT Short Term Goals - 04/22/21 1031       PT  SHORT TERM GOAL #1   Title Pt will be independent and compiant with HEP for improved pain, posture, core strength, and function.    Time 4    Period Weeks    Status Achieved    Target Date 04/15/21      PT SHORT TERM GOAL #2   Title Pt will report at least a 25% improvement in standing activities and daily mobility.    Baseline Pt reports 50% improvement    Time 4    Period Weeks    Status Achieved    Target Date 04/15/21      PT SHORT TERM GOAL #3   Title Pt will be able to perform sit/stand transfers and car transfers without increased pain.    Time 6    Period Weeks    Status Achieved    Target Date 04/29/21      PT SHORT TERM GOAL #4   Title Pt will demo improved posture standing erect with scapulas retracted having reduced forward shoulders for reduced stress and strain on lumbar and to promote postural alignment.    Time 6    Period Weeks    Status Partially Met    Target Date 04/29/21      PT SHORT TERM GOAL #5   Title Pt will report increased ambulation distance with reduced pain.    Time 6    Period Weeks    Status Achieved    Target Date 04/29/21               PT Long Term Goals - 03/18/21 2333       PT LONG TERM GOAL #1   Title Pt will demo improved core strength as evidenced by progressing with core exercises without adverse effects and demo 4+ to 5/5 bilat hip flex/abd/ER and knee extension strength for improved performance and tolerance of functional mobility and to assist with returning to PLOF.    Time --   10-12   Period Weeks    Status New    Target Date 06/10/21      PT LONG TERM GOAL #2   Title Pt will be able to perform her normal standing activities including household chores without significant pain or limitation.    Time --   10-12 weeks   Target Date 06/10/21      PT LONG TERM  GOAL #3   Title Pt will be able to perform her normal community amblulation without increased pain.    Time 8    Period Weeks    Status New    Target Date  05/13/21      PT LONG TERM GOAL #4   Title Pt will report she is able to perform a walking program without significant pain.    Time --   10-12   Period Weeks    Status New    Target Date 06/10/21      PT LONG TERM GOAL #5   Title pt will be able to carry her groceries without significant pain or difficulty.    Time --   10-12   Period Weeks    Status New    Target Date 06/10/21                   Plan - 05/06/21 1047     Clinical Impression Statement Pt is progressing well with pain, tolerance to activity, function, and toward goals. Pt reports she is able to stand longer duration without pain and is increasing her ambulation distance. Pt has much improved pain since beginning PT and also has reduced achiness with standing.  Pt requires cuing to slow down and control certain exercises including bridging.  Pt performed exercises well and reports feeling better after the exercises.   Pt states she felt great after STM, "pretty much no pain".   Pt responded well to Rx stating "I   feel much better than when I came in" and had no pain after Rx.  Pt should cont to beneft from cont skilled PT services to address ongoing goals and to restore PLOF.    Comorbidities osteopenia in multiple sites    PT Treatment/Interventions ADLs/Self Care Home Management;Aquatic Therapy;Cryotherapy;Moist Heat;Electrical Stimulation;Functional mobility training;Therapeutic activities;Therapeutic exercise;Balance training;Neuromuscular re-education;Manual techniques;Patient/family education;Gait training;Dry needling    PT Next Visit Plan Cont with STM and core and postural exercises per dx and pt tolerance.    PT Home Exercise Plan Access Code: [email or web address]    Consulted and Agree with Plan of Care Patient             Patient will benefit from skilled therapeutic intervention in order to improve the following deficits and impairments:  Decreased activity tolerance, Decreased mobility, Decreased endurance,  Decreased strength, Difficulty walking, Impaired flexibility, Postural dysfunction  Visit Diagnosis: Low back pain without sciatica, unspecified back pain laterality, unspecified chronicity  Muscle weakness (generalized)  Abnormal posture     Problem List Patient Active Problem List   Diagnosis Date Noted   Closed compression fracture of body of L1 vertebra ([place]) 03/09/2021   Vitamin D deficiency 06/25/2020   Positive colorectal cancer screening using Cologuard test 08/20/2019   Uterine cancer ([place]) 08/01/2019   Hypercholesterolemia 05/19/2018   Osteopenia of multiple sites 05/19/2018    Selinda Michaels III PT, DPT 05/06/21 9:48 PM   [facility] [address] [place], [place], [address] Phone: [phone]   Fax:  [phone]  Name: Suzanne Kelley MRN: [id number] Date of Birth: [birth year]

## 2021-05-13 ENCOUNTER — Other Ambulatory Visit: Payer: Self-pay

## 2021-05-13 ENCOUNTER — Ambulatory Visit (HOSPITAL_BASED_OUTPATIENT_CLINIC_OR_DEPARTMENT_OTHER): Payer: BLUE CROSS/BLUE SHIELD | Attending: Family Medicine | Admitting: Physical Therapy

## 2021-05-13 ENCOUNTER — Encounter (HOSPITAL_BASED_OUTPATIENT_CLINIC_OR_DEPARTMENT_OTHER): Payer: Self-pay | Admitting: Physical Therapy

## 2021-05-13 DIAGNOSIS — M545 Low back pain, unspecified: Secondary | ICD-10-CM | POA: Insufficient documentation

## 2021-05-13 DIAGNOSIS — R293 Abnormal posture: Secondary | ICD-10-CM | POA: Insufficient documentation

## 2021-05-13 DIAGNOSIS — M6281 Muscle weakness (generalized): Secondary | ICD-10-CM | POA: Diagnosis not present

## 2021-05-13 NOTE — Therapy (Signed)
[place], [place], [address] Phone: [phone]   Fax:  [phone]  Physical Therapy Treatment  Patient Details  Name: Suzanne Kelley MRN: [id number] Date of Birth: [birth year] Referring Provider (PT): Vivi Barrack, MD   Encounter Date: 05/13/2021   PT End of Session - 05/13/21 1112     Visit Number 7    Number of Visits 22    Date for PT Re-Evaluation 06/10/21    Authorization Type BC[employer]    Progress Note Due on Visit --   05/22/2021   PT Start Time 1033    PT Stop Time 1111    PT Time Calculation (min) 38 min    Activity Tolerance Patient tolerated treatment well    Behavior During Therapy [facility] for tasks assessed/performed             Past Medical History:  Diagnosis Date   High cholesterol    History of chicken pox    Hyperlipidemia    Uterine cancer ([place])    UTI (urinary tract infection)     Past Surgical History:  Procedure Laterality Date   ABDOMINAL HYSTERECTOMY     APPENDECTOMY      There were no vitals filed for this visit.   Subjective Assessment - 05/13/21 1037     Subjective Pt reports she thinks she has been walking too much.  Pt increased her walking distance to 6,200 steps per day and had increased pain afterwards.   Pt states she feels achy first thing in AM.  Pt states she felt great after prior Rx.  Pt reduced her T band exercises to 3x/wk, the recommended frequency.  pt states she is not having pain at the one spot she used to.    Pertinent History osteopenia in multiple sites.  x-ray indicated compression fx L1 with 40% loss of height anteriorly and trace/grade 1 anterolisthesis at L5-S1.  uterine CA in the 90's    Diagnostic tests X rays on 03/12/21 indicated stable appearance of L1 compression Fx from prior exam.  compression  with 40% loss of height anteriorly.  pt has transitional lumbosacral anatomy.  no new abnormality.  L5-S1 facet hypertrophy with trace/grade 1  anterolisthesis, unchanged.    Currently in Pain? Yes    Pain Score --   2-3/10   Pain Location --   central and bilat lower lumbar                              [facility] Adult PT Treatment/Exercise - 05/13/21 0001       Neuro Re-ed    Neuro Re-ed Details  standing marching on airex with UE assist 3x10 reps, standing with FA with EC 3x15" on airex, tandem gait x with 1 UE assist   Also see flow sheet for T band scapular exercises to promote improved postural strength and stability.     Exercises   Exercises Lumbar   Answered Pt's questions.  Reviewed response to prior Rx, current function, and pain level.     Lumbar Exercises: Standing   Row Limitations 2x10 with GTB    Other Standing Lumbar Exercises low rows with GTB with retraction 2x10 reps      Lumbar Exercises: Supine   Other Supine Lumbar Exercises supine bridge 2 x 10 reps, supine alt LE ext with TrA 2x10 reps, supine clams with TrA contraction with GTB 2x10 reps  Manual Therapy   Manual Therapy Soft tissue mobilization    Soft tissue mobilization to bilat lumbar paraspinals and erector spinae in prone to improve soft tissue mobility, tightness, and pain                     PT Education - 05/13/21 1040     Education Details Educated pt concerning walking program and reducing frequency/distance if having increased pain.  Educated pt in appropriate progression rules.  Answered pt's questions. Educated pt concerning sx management and appropriate frequency of home exercises.    Person(s) Educated Patient    Methods Explanation    Comprehension Verbalized understanding              PT Short Term Goals - 04/22/21 1031       PT SHORT TERM GOAL #1   Title Pt will be independent and compiant with HEP for improved pain, posture, core strength, and function.    Time 4    Period Weeks    Status Achieved    Target Date 04/15/21      PT SHORT TERM GOAL #2   Title Pt will report at least a  25% improvement in standing activities and daily mobility.    Baseline Pt reports 50% improvement    Time 4    Period Weeks    Status Achieved    Target Date 04/15/21      PT SHORT TERM GOAL #3   Title Pt will be able to perform sit/stand transfers and car transfers without increased pain.    Time 6    Period Weeks    Status Achieved    Target Date 04/29/21      PT SHORT TERM GOAL #4   Title Pt will demo improved posture standing erect with scapulas retracted having reduced forward shoulders for reduced stress and strain on lumbar and to promote postural alignment.    Time 6    Period Weeks    Status Partially Met    Target Date 04/29/21      PT SHORT TERM GOAL #5   Title Pt will report increased ambulation distance with reduced pain.    Time 6    Period Weeks    Status Achieved    Target Date 04/29/21               PT Long Term Goals - 03/18/21 2333       PT LONG TERM GOAL #1   Title Pt will demo improved core strength as evidenced by progressing with core exercises without adverse effects and demo 4+ to 5/5 bilat hip flex/abd/ER and knee extension strength for improved performance and tolerance of functional mobility and to assist with returning to PLOF.    Time --   10-12   Period Weeks    Status New    Target Date 06/10/21      PT LONG TERM GOAL #2   Title Pt will be able to perform her normal standing activities including household chores without significant pain or limitation.    Time --   10-12 weeks   Target Date 06/10/21      PT LONG TERM GOAL #3   Title Pt will be able to perform her normal community amblulation without increased pain.    Time 8    Period Weeks    Status New    Target Date 05/13/21      PT LONG TERM GOAL #4   Title  Pt will report she is able to perform a walking program without significant pain.    Time --   10-12   Period Weeks    Status New    Target Date 06/10/21      PT LONG TERM GOAL #5   Title pt will be able to carry  her groceries without significant pain or difficulty.    Time --   10-12   Period Weeks    Status New    Target Date 06/10/21                   Plan - 05/13/21 1112     Clinical Impression Statement Pt thinks she had had increased pain recently due to increasing her walking program and states it feels muscular.  Educated pt concerning walking program and appropriate distance/frequency/progression.  She is feeling better overall.  She states she is not having the pain she used to have.  Pt is compliant with HEP and has performed appropriate frequency.  Pt reports improved sx's with STM.  Pt performed exercises well though does require cuing for control.  Pt responded well to Rx stating she felt better after Rx having improved pain to 1/10.    Comorbidities osteopenia in multiple sites    PT Treatment/Interventions ADLs/Self Care Home Management;Aquatic Therapy;Cryotherapy;Moist Heat;Electrical Stimulation;Functional mobility training;Therapeutic activities;Therapeutic exercise;Balance training;Neuromuscular re-education;Manual techniques;Patient/family education;Gait training;Dry needling    PT Next Visit Plan Cont with STM and core and postural exercises per dx and pt tolerance.    PT Home Exercise Plan Access Code: [email or web address]    Consulted and Agree with Plan of Care Patient             Patient will benefit from skilled therapeutic intervention in order to improve the following deficits and impairments:  Decreased activity tolerance, Decreased mobility, Decreased endurance, Decreased strength, Difficulty walking, Impaired flexibility, Postural dysfunction  Visit Diagnosis: Low back pain without sciatica, unspecified back pain laterality, unspecified chronicity  Muscle weakness (generalized)  Abnormal posture     Problem List Patient Active Problem List   Diagnosis Date Noted   Closed compression fracture of body of L1 vertebra ([place]) 03/09/2021   Vitamin D deficiency  06/25/2020   Positive colorectal cancer screening using Cologuard test 08/20/2019   Uterine cancer ([place]) 08/01/2019   Hypercholesterolemia 05/19/2018   Osteopenia of multiple sites 05/19/2018    Selinda Michaels III PT, DPT 05/13/21 11:09 PM   [facility] [address] [place], [place], [address] Phone: [phone]   Fax:  [phone]  Name: Suzanne Kelley MRN: [id number] Date of Birth: 1955/10/22

## 2021-05-20 ENCOUNTER — Ambulatory Visit (HOSPITAL_BASED_OUTPATIENT_CLINIC_OR_DEPARTMENT_OTHER): Payer: BLUE CROSS/BLUE SHIELD | Admitting: Physical Therapy

## 2021-05-20 ENCOUNTER — Other Ambulatory Visit: Payer: Self-pay

## 2021-05-20 ENCOUNTER — Encounter (HOSPITAL_BASED_OUTPATIENT_CLINIC_OR_DEPARTMENT_OTHER): Payer: Self-pay | Admitting: Physical Therapy

## 2021-05-20 DIAGNOSIS — M6281 Muscle weakness (generalized): Secondary | ICD-10-CM

## 2021-05-20 DIAGNOSIS — R293 Abnormal posture: Secondary | ICD-10-CM

## 2021-05-20 DIAGNOSIS — M545 Low back pain, unspecified: Secondary | ICD-10-CM | POA: Diagnosis not present

## 2021-05-20 NOTE — Therapy (Signed)
[place], [place], [address] Phone: [phone]   Fax:  [phone]  Physical Therapy Treatment  Patient Details  Name: Suzanne Kelley MRN: [id number] Date of Birth: [birth year] Referring Provider (PT): Vivi Barrack, MD   Encounter Date: 05/20/2021   PT End of Session - 05/20/21 1113     Visit Number 8    Number of Visits 22    Date for PT Re-Evaluation 06/10/21    Authorization Type BCBS    Progress Note Due on Visit --   05/22/21   PT Start Time 1025    PT Stop Time 1106    PT Time Calculation (min) 41 min    Activity Tolerance Patient tolerated treatment well    Behavior During Therapy [facility] for tasks assessed/performed             Past Medical History:  Diagnosis Date   High cholesterol    History of chicken pox    Hyperlipidemia    Uterine cancer ([place])    UTI (urinary tract infection)     Past Surgical History:  Procedure Laterality Date   ABDOMINAL HYSTERECTOMY     APPENDECTOMY      There were no vitals filed for this visit.   Subjective Assessment - 05/20/21 1028     Subjective Pt is very appreciative of skilled PT services.  Pt states she had her best day yet yesterday.  Pt reports her pain has improved and she is not having as much pain as she used to.  She is performing her walking program and followed advice of PT with reducing walking distance.  Pt states she felt better and didn't have any pain exacerbations with her walking program.  Pt reports compliance with HEP.  Pt wants to transition to personal training after completing PT.    Pertinent History osteopenia in multiple sites.  x-ray indicated compression fx L1 with 40% loss of height anteriorly and trace/grade 1 anterolisthesis at L5-S1.  uterine CA in the 90's    Currently in Pain? Yes    Pain Score 1     Pain Location --   R sided lumbar                              [facility] Adult PT  Treatment/Exercise - 05/20/21 0001       Neuro Re-ed    Neuro Re-ed Details  standing marching on airex with UE assist 3x10 reps, tandem stance 2x20 sec bilat, tandem gait with 1 UE assist   Also see flow sheet for T band scapular exercises to promote improved postural strength and stability.     Exercises   Exercises Lumbar   Answered Pt's questions.  Reviewed response to prior Rx, current function, and pain level.     Lumbar Exercises: Standing   Other Standing Lumbar Exercises rows with retraction with GTB and low rows with GTB with retraction 2x10 reps each      Lumbar Exercises: Supine   Other Supine Lumbar Exercises supine bridge with feet on airex 2 x 10 reps, supine alt LE ext with TrA 2x10 reps, supine clams with TrA contraction with GTB 2x10 reps    Other Supine Lumbar Exercises q-ped alt UE raise with TrA 2x10 reps      Manual Therapy   Manual Therapy Soft tissue mobilization    Soft tissue mobilization to bilat lumbar paraspinals and erector  spinae in prone to improve soft tissue mobility, tightness, and pain                     PT Education - 05/20/21 1239     Education Details HEP. POC. Educated pt concerning walking program and appropriate progression rules. Answered pt's questions.  Instructed pt she will need clearance from her MD to begin with personal training when the time comes.    Person(s) Educated Patient    Methods Explanation    Comprehension Verbalized understanding;Returned demonstration              PT Short Term Goals - 04/22/21 1031       PT SHORT TERM GOAL #1   Title Pt will be independent and compiant with HEP for improved pain, posture, core strength, and function.    Time 4    Period Weeks    Status Achieved    Target Date 04/15/21      PT SHORT TERM GOAL #2   Title Pt will report at least a 25% improvement in standing activities and daily mobility.    Baseline Pt reports 50% improvement    Time 4    Period Weeks     Status Achieved    Target Date 04/15/21      PT SHORT TERM GOAL #3   Title Pt will be able to perform sit/stand transfers and car transfers without increased pain.    Time 6    Period Weeks    Status Achieved    Target Date 04/29/21      PT SHORT TERM GOAL #4   Title Pt will demo improved posture standing erect with scapulas retracted having reduced forward shoulders for reduced stress and strain on lumbar and to promote postural alignment.    Time 6    Period Weeks    Status Partially Met    Target Date 04/29/21      PT SHORT TERM GOAL #5   Title Pt will report increased ambulation distance with reduced pain.    Time 6    Period Weeks    Status Achieved    Target Date 04/29/21               PT Long Term Goals - 03/18/21 2333       PT LONG TERM GOAL #1   Title Pt will demo improved core strength as evidenced by progressing with core exercises without adverse effects and demo 4+ to 5/5 bilat hip flex/abd/ER and knee extension strength for improved performance and tolerance of functional mobility and to assist with returning to PLOF.    Time --   10-12   Period Weeks    Status New    Target Date 06/10/21      PT LONG TERM GOAL #2   Title Pt will be able to perform her normal standing activities including household chores without significant pain or limitation.    Time --   10-12 weeks   Target Date 06/10/21      PT LONG TERM GOAL #3   Title Pt will be able to perform her normal community amblulation without increased pain.    Time 8    Period Weeks    Status New    Target Date 05/13/21      PT LONG TERM GOAL #4   Title Pt will report she is able to perform a walking program without significant pain.    Time --  10-12   Period Weeks    Status New    Target Date 06/10/21      PT LONG TERM GOAL #5   Title pt will be able to carry her groceries without significant pain or difficulty.    Time --   10-12   Period Weeks    Status New    Target Date 06/10/21                    Plan - 05/20/21 2313     Clinical Impression Statement Pt continues to progress well in all areas and stated she has her best day yesterday.  Pt has reduced her walking program and is feeling much better.  She continues to report improved pain.  Pt demonstrates improved form and control with exercises.  Pt responded well to Rx reporting improved pain to none after Rx.  Pt should cont to benefit from cont skilled PT services to address ongoing goals and to restore PLOF.    PT Treatment/Interventions ADLs/Self Care Home Management;Aquatic Therapy;Cryotherapy;Moist Heat;Electrical Stimulation;Functional mobility training;Therapeutic activities;Therapeutic exercise;Balance training;Neuromuscular re-education;Manual techniques;Patient/family education;Gait training;Dry needling    PT Next Visit Plan Cont with STM and core and postural exercises per dx and pt tolerance.  PN next visit.    PT Home Exercise Plan Access Code: [email or web address]    Consulted and Agree with Plan of Care Patient             Patient will benefit from skilled therapeutic intervention in order to improve the following deficits and impairments:  Decreased activity tolerance, Decreased mobility, Decreased endurance, Decreased strength, Difficulty walking, Impaired flexibility, Postural dysfunction  Visit Diagnosis: Low back pain without sciatica, unspecified back pain laterality, unspecified chronicity  Muscle weakness (generalized)  Abnormal posture     Problem List Patient Active Problem List   Diagnosis Date Noted   Closed compression fracture of body of L1 vertebra ([place]) 03/09/2021   Vitamin D deficiency 06/25/2020   Positive colorectal cancer screening using Cologuard test 08/20/2019   Uterine cancer ([place]) 08/01/2019   Hypercholesterolemia 05/19/2018   Osteopenia of multiple sites 05/19/2018    Selinda Michaels III PT, DPT 05/20/21 11:20 PM   [facility] [address] [place], [place], [address] Phone: [phone]   Fax:  [phone]  Name: Suzanne Kelley MRN: [id number] Date of Birth: [birth year]

## 2021-05-27 ENCOUNTER — Ambulatory Visit (HOSPITAL_BASED_OUTPATIENT_CLINIC_OR_DEPARTMENT_OTHER): Payer: BLUE CROSS/BLUE SHIELD | Admitting: Physical Therapy

## 2021-05-27 ENCOUNTER — Other Ambulatory Visit: Payer: Self-pay

## 2021-05-27 ENCOUNTER — Encounter (HOSPITAL_BASED_OUTPATIENT_CLINIC_OR_DEPARTMENT_OTHER): Payer: Self-pay | Admitting: Physical Therapy

## 2021-05-27 DIAGNOSIS — M6281 Muscle weakness (generalized): Secondary | ICD-10-CM | POA: Diagnosis not present

## 2021-05-27 DIAGNOSIS — R293 Abnormal posture: Secondary | ICD-10-CM

## 2021-05-27 DIAGNOSIS — M545 Low back pain, unspecified: Secondary | ICD-10-CM | POA: Diagnosis not present

## 2021-05-27 NOTE — Therapy (Signed)
[place], [place], [address] Phone: [phone]   Fax:  [phone]  Physical Therapy Treatment/Progress Note  Progress Note Reporting Period 04/29/2021 to 05/27/2021  See note below for Objective Data and Assessment of Progress/Goals.      Patient Details  Name: Suzanne Kelley MRN: [id number] Date of Birth: 07-Oct-1955 Referring Provider (PT): Vivi Barrack, MD   Encounter Date: 05/27/2021   PT End of Session - 05/27/21 1036     Visit Number 9    Number of Visits 22    Date for PT Re-Evaluation 06/10/21    Authorization Type BCBS    PT Start Time 1022    PT Stop Time 1119    PT Time Calculation (min) 57 min    Activity Tolerance Patient tolerated treatment well    Behavior During Therapy WFL for tasks assessed/performed             Past Medical History:  Diagnosis Date   High cholesterol    History of chicken pox    Hyperlipidemia    Uterine cancer ([place])    UTI (urinary tract infection)     Past Surgical History:  Procedure Laterality Date   ABDOMINAL HYSTERECTOMY     APPENDECTOMY      There were no vitals filed for this visit.   Subjective Assessment - 05/27/21 1029     Subjective Pt states she was doing really good until she bent over to pick up a box.  Pt states she felt muscular pain in her R sided lumbar when picking up box.  Pt states this pain has improved though she still has a slight 1/10 pain currently.  Pt is able to load and unload dishwasher fine and unloads 1 dish at a time.  Pt has improved ability to perform laundry.  pt is able to carry 3 towels at a time but unable to carry a group of wet towels.  Pt reports improved stamina including standing duration and ambulation distance.  pt is walking 6,000 steps per day without adverse effects.  Pt walked 6,400 steps without increased pain.  Pt states she had a little muscular pain in lumbar the following day after prior  Rx which went away with Advil.  Pt states her back does get weak with prolonged ambulation.    Pertinent History osteopenia in multiple sites.  x-ray indicated compression fx L1 with 40% loss of height anteriorly and trace/grade 1 anterolisthesis at L5-S1.  uterine CA in the 90's    Currently in Pain? Yes    Pain Score 1    1-2/10 worst pain, more of a weakness per pt   Pain Location --   R sided lumbar               [facility] PT Assessment - 05/27/21 0001       Observation/Other Assessments   Other Surveys  Modified Oswestry    Modified Oswestry 34%      ROM / Strength   AROM / PROM / Strength AROM;Strength      AROM   Lumbar - Right Rotation WFL   No pain   Lumbar - Left Rotation 75%   No pain     Strength   Strength Assessment Site Hip;Knee    Right/Left Hip Right;Left    Right Hip Flexion 5/5    Right Hip External Rotation  5/5    Right Hip ABduction 5/5    Left Hip Flexion  4+/5    Left Hip External Rotation 5/5    Left Hip ABduction 5/5    Right/Left Knee Right;Left    Right Knee Extension 5/5    Left Knee Extension 4+/5      Ambulation/Gait   Gait Comments no limping.  Limited R arm swing                           [facility] Adult PT Treatment/Exercise - 05/27/21 0001       Therapeutic Activites    Therapeutic Activities Other Therapeutic Activities    Other Therapeutic Activities Instructed pt in proper body mechanics and to not bend and lift.  Educated and demonstrated how to properly perform squat to lift and hip hinge.  instructed pt in keeping load close and to not lift heavy objects.      Neuro Re-ed    Neuro Re-ed Details  standing marching on airex with UE assist 3x10 reps, tandem gait with 1 UE assist and without UE assist   Also see flow sheet for T band scapular exercises to promote improved postural strength and stability.     Exercises   Exercises Lumbar   Answered Pt's questions.  Reviewed response to prior Rx, current function, and  pain level.  Assessed ROM and strength.  Educated pt concerning objective findings.  Pt completed Oswestry     Lumbar Exercises: Standing   Other Standing Lumbar Exercises rows with retraction with GTB and low rows with GTB with retraction 2x10 reps each      Lumbar Exercises: Supine   Other Supine Lumbar Exercises supine bridge with feet on airex 2 x 10 reps    Other Supine Lumbar Exercises q-ped alt UE raise with TrA 2x10 reps      Manual Therapy   Manual Therapy Soft tissue mobilization    Soft tissue mobilization to bilat lumbar paraspinals and erector spinae in prone to improve soft tissue mobility, tightness, and pain                     PT Education - 05/27/21 1027     Education Details Educated pt in dx, healing, and rationale of exercises.  Instructed pt in proper body mechanics and to not bend and lift.  Educated and demonstrated how to properly perform squat to lift and hip hinge.  Instructed pt to not lift heavy objects.   Educated pt concerning objective findings.    Person(s) Educated Patient    Methods Demonstration;Explanation;Verbal cues    Comprehension Verbalized understanding;Returned demonstration;Verbal cues required;Need further instruction              PT Short Term Goals - 04/22/21 1031       PT SHORT TERM GOAL #1   Title Pt will be independent and compiant with HEP for improved pain, posture, core strength, and function.    Time 4    Period Weeks    Status Achieved    Target Date 04/15/21      PT SHORT TERM GOAL #2   Title Pt will report at least a 25% improvement in standing activities and daily mobility.    Baseline Pt reports 50% improvement    Time 4    Period Weeks    Status Achieved    Target Date 04/15/21      PT SHORT TERM GOAL #3   Title Pt will be able to perform sit/stand transfers and car transfers without  increased pain.    Time 6    Period Weeks    Status Achieved    Target Date 04/29/21      PT SHORT TERM GOAL #4    Title Pt will demo improved posture standing erect with scapulas retracted having reduced forward shoulders for reduced stress and strain on lumbar and to promote postural alignment.    Time 6    Period Weeks    Status Partially Met    Target Date 04/29/21      PT SHORT TERM GOAL #5   Title Pt will report increased ambulation distance with reduced pain.    Time 6    Period Weeks    Status Achieved    Target Date 04/29/21               PT Long Term Goals - 05/27/21 1059       PT LONG TERM GOAL #1   Title Pt will demo improved core strength as evidenced by progressing with core exercises without adverse effects and demo 4+ to 5/5 bilat hip flex/abd/ER and knee extension strength for improved performance and tolerance of functional mobility and to assist with returning to PLOF.    Time --   10-12 weeks   Period Weeks    Status Achieved    Target Date 06/10/21      PT LONG TERM GOAL #2   Title Pt will be able to perform her normal standing activities including household chores without significant pain or limitation.    Time 2    Period Weeks    Status Partially Met    Target Date 06/10/21      PT LONG TERM GOAL #3   Title Pt will be able to perform her normal community amblulation without increased pain.    Time 8    Period Weeks    Status Partially Met      PT LONG TERM GOAL #4   Title Pt will report she is able to perform a walking program without significant pain.    Time --   10-12 weeks   Period Weeks    Status Achieved      PT LONG TERM GOAL #5   Title pt will be able to carry her groceries without significant pain or difficulty.    Time --   10-12 weeks   Period Weeks    Status Partially Met    Target Date 06/10/21                   Plan - 05/27/21 2227     Clinical Impression Statement Pt continues to make good progress in PT.  Pt reports improved stamina including standing duration and ambulation distance.  She has been increasing her  program recently without adverse effects and is walking 6,000 steps per day.  Pt has reduced pain with ADLs/IADLs and mobility.  Pt is improving with performance of household chores and IADLs though is limited with lifting objects.  Pt reports having increased pain yesterday with bending over to pick up a box.  Pt requires instruction in correct form with lifting objects and PT provided education today.  Pt demonstrates good strength in bilat LEs with minimal weakness in L hip flexion and L knee extension.  pt is progressing well toward goals.  She has met LTG's #1,4 and partially met #2,3,5.  Pt should benefit from cont skilled PT services to address ongoin goals and to restore PLOF  PT Frequency 1x / week    PT Duration 2 weeks    PT Treatment/Interventions ADLs/Self Care Home Management;Aquatic Therapy;Cryotherapy;Moist Heat;Electrical Stimulation;Functional mobility training;Therapeutic activities;Therapeutic exercise;Balance training;Neuromuscular re-education;Manual techniques;Patient/family education;Gait training;Dry needling    PT Next Visit Plan Cont with STM and core and postural exercises per dx and pt tolerance.  Cont 1x/wk for 2 more weeks.    PT Home Exercise Plan Access Code: [email or web address]    Consulted and Agree with Plan of Care Patient             Patient will benefit from skilled therapeutic intervention in order to improve the following deficits and impairments:  Decreased activity tolerance, Decreased mobility, Decreased endurance, Decreased strength, Difficulty walking, Impaired flexibility, Postural dysfunction  Visit Diagnosis: Low back pain without sciatica, unspecified back pain laterality, unspecified chronicity  Muscle weakness (generalized)  Abnormal posture     Problem List Patient Active Problem List   Diagnosis Date Noted   Closed compression fracture of body of L1 vertebra ([place]) 03/09/2021   Vitamin D deficiency 06/25/2020   Positive colorectal cancer  screening using Cologuard test 08/20/2019   Uterine cancer ([place]) 08/01/2019   Hypercholesterolemia 05/19/2018   Osteopenia of multiple sites 05/19/2018    Selinda Michaels III PT, DPT 05/27/21 10:40 PM   [place], [place], [address] Phone: [phone]   Fax:  [phone]  Name: Suzanne Kelley MRN: [id number] Date of Birth: 05-05-1956

## 2021-06-03 ENCOUNTER — Encounter (HOSPITAL_BASED_OUTPATIENT_CLINIC_OR_DEPARTMENT_OTHER): Payer: BLUE CROSS/BLUE SHIELD | Attending: Family Medicine | Admitting: Physical Therapy

## 2021-06-03 ENCOUNTER — Other Ambulatory Visit: Payer: Self-pay

## 2021-06-03 DIAGNOSIS — M545 Low back pain, unspecified: Secondary | ICD-10-CM | POA: Diagnosis not present

## 2021-06-03 DIAGNOSIS — R293 Abnormal posture: Secondary | ICD-10-CM

## 2021-06-03 DIAGNOSIS — M6281 Muscle weakness (generalized): Secondary | ICD-10-CM

## 2021-06-04 ENCOUNTER — Encounter (HOSPITAL_BASED_OUTPATIENT_CLINIC_OR_DEPARTMENT_OTHER): Payer: Self-pay | Admitting: Physical Therapy

## 2021-06-04 NOTE — Therapy (Signed)
[place], [place], [address] Phone: [phone]   Fax:  [phone]  Physical Therapy Treatment  Patient Details  Name: Suzanne Kelley MRN: [id number] Date of Birth: 28-Nov-1955 Referring Provider (PT): Vivi Barrack, MD   Encounter Date: 06/03/2021   PT End of Session - 06/03/21 1030     Visit Number 10    Number of Visits 22    Date for PT Re-Evaluation 06/10/21    Authorization Type BCBS    PT Start Time 1022    PT Stop Time 1105    PT Time Calculation (min) 43 min    Activity Tolerance Patient tolerated treatment well    Behavior During Therapy WFL for tasks assessed/performed             Past Medical History:  Diagnosis Date   High cholesterol    History of chicken pox    Hyperlipidemia    Uterine cancer ([place])    UTI (urinary tract infection)     Past Surgical History:  Procedure Laterality Date   ABDOMINAL HYSTERECTOMY     APPENDECTOMY      There were no vitals filed for this visit.   Subjective Assessment - 06/03/21 1031     Subjective Pt reports 1/10 pain on R side lumbar currently.  "Feels more muscular".  Pt states she stood for approx 45 mins having a conversation and did have some pain.  Pt states she felt good for the 1st 30 mins.  Pt states she has had a good week.  Pt was able to ambulate 7,000 steps without adverse effects.  Pt states she is so excited about how much progress sh has made.    Currently in Pain? Yes    Pain Score 1     Pain Location --   R sided lumbar                              [facility] Adult PT Treatment/Exercise - 06/04/21 0001       Neuro Re-ed    Neuro Re-ed Details  standing marching on airex with UE assist 3x10 reps, tandem gait with 1 UE assist and without UE assist   Also see flow sheet for T band scapular exercises to promote improved postural strength and stability.     Exercises   Exercises Lumbar   Answered Pt's  questions.  Reviewed response to prior Rx, current function, and pain level.     Lumbar Exercises: Aerobic   Recumbent Bike sci fit bike x 3 mins      Lumbar Exercises: Standing   Other Standing Lumbar Exercises rows with retraction with BTB and low rows with GTB with retraction 2x10 reps each      Lumbar Exercises: Supine   Other Supine Lumbar Exercises supine bridge with feet on airex 3 x 10 reps    Other Supine Lumbar Exercises q-ped alt UE raise with TrA 2x10 reps      Manual Therapy   Manual Therapy Soft tissue mobilization    Soft tissue mobilization to bilat lumbar paraspinals and erector spinae in prone to improve soft tissue mobility, tightness, and pain                     PT Education - 06/04/21 0814     Education Details POC, dx, and exercise form.  Answered Pt's questions.    Person(s)  Educated Patient    Methods Explanation;Demonstration;Verbal cues    Comprehension Returned demonstration;Verbalized understanding              PT Short Term Goals - 04/22/21 1031       PT SHORT TERM GOAL #1   Title Pt will be independent and compiant with HEP for improved pain, posture, core strength, and function.    Time 4    Period Weeks    Status Achieved    Target Date 04/15/21      PT SHORT TERM GOAL #2   Title Pt will report at least a 25% improvement in standing activities and daily mobility.    Baseline Pt reports 50% improvement    Time 4    Period Weeks    Status Achieved    Target Date 04/15/21      PT SHORT TERM GOAL #3   Title Pt will be able to perform sit/stand transfers and car transfers without increased pain.    Time 6    Period Weeks    Status Achieved    Target Date 04/29/21      PT SHORT TERM GOAL #4   Title Pt will demo improved posture standing erect with scapulas retracted having reduced forward shoulders for reduced stress and strain on lumbar and to promote postural alignment.    Time 6    Period Weeks    Status Partially  Met    Target Date 04/29/21      PT SHORT TERM GOAL #5   Title Pt will report increased ambulation distance with reduced pain.    Time 6    Period Weeks    Status Achieved    Target Date 04/29/21               PT Long Term Goals - 05/27/21 1059       PT LONG TERM GOAL #1   Title Pt will demo improved core strength as evidenced by progressing with core exercises without adverse effects and demo 4+ to 5/5 bilat hip flex/abd/ER and knee extension strength for improved performance and tolerance of functional mobility and to assist with returning to PLOF.    Time --   10-12 weeks   Period Weeks    Status Achieved    Target Date 06/10/21      PT LONG TERM GOAL #2   Title Pt will be able to perform her normal standing activities including household chores without significant pain or limitation.    Time 2    Period Weeks    Status Partially Met    Target Date 06/10/21      PT LONG TERM GOAL #3   Title Pt will be able to perform her normal community amblulation without increased pain.    Time 8    Period Weeks    Status Partially Met      PT LONG TERM GOAL #4   Title Pt will report she is able to perform a walking program without significant pain.    Time --   10-12 weeks   Period Weeks    Status Achieved      PT LONG TERM GOAL #5   Title pt will be able to carry her groceries without significant pain or difficulty.    Time --   10-12 weeks   Period Weeks    Status Partially Met    Target Date 06/10/21  Plan - 06/03/21 1037     Clinical Impression Statement Pt is making excellent progress.  She continues to increase her walking program without adverse effects.  Pt has improved tolerance with daily activities and daily mobility.  She has expected limitations with lifting.  Pt performed exercises well with cuing for correct form.  Pt had no pain in lumbar with recumbent bike though had labored breathing.  She is improving with core and postural  strength as evidenced by performance of exercises.  Pt demonstrates improved form and control with bridge.  Pt responded well to Rx stating she felt better after Rx than when she cam in.  Pt should benefit from cont skilled PT services to address ongoin goals and to restore PLOF.    PT Treatment/Interventions ADLs/Self Care Home Management;Aquatic Therapy;Cryotherapy;Moist Heat;Electrical Stimulation;Functional mobility training;Therapeutic activities;Therapeutic exercise;Balance training;Neuromuscular re-education;Manual techniques;Patient/family education;Gait training;Dry needling    PT Next Visit Plan Discharge pt next visit.  Review hip hinge technique/squat to lift.    PT Home Exercise Plan Access Code: [email or web address]    Consulted and Agree with Plan of Care Patient             Patient will benefit from skilled therapeutic intervention in order to improve the following deficits and impairments:  Decreased activity tolerance, Decreased mobility, Decreased endurance, Decreased strength, Difficulty walking, Impaired flexibility, Postural dysfunction  Visit Diagnosis: Low back pain without sciatica, unspecified back pain laterality, unspecified chronicity  Muscle weakness (generalized)  Abnormal posture     Problem List Patient Active Problem List   Diagnosis Date Noted   Closed compression fracture of body of L1 vertebra ([place]) 03/09/2021   Vitamin D deficiency 06/25/2020   Positive colorectal cancer screening using Cologuard test 08/20/2019   Uterine cancer ([place]) 08/01/2019   Hypercholesterolemia 05/19/2018   Osteopenia of multiple sites 05/19/2018    Selinda Michaels III PT, DPT 06/04/21 8:30 AM   [place], [place], [address] Phone: [phone]   Fax:  [phone]  Name: Suzanne Kelley MRN: [id number] Date of Birth: 04-04-1956

## 2021-06-10 ENCOUNTER — Ambulatory Visit (HOSPITAL_BASED_OUTPATIENT_CLINIC_OR_DEPARTMENT_OTHER): Payer: BLUE CROSS/BLUE SHIELD | Attending: Family Medicine | Admitting: Physical Therapy

## 2021-06-10 ENCOUNTER — Other Ambulatory Visit: Payer: Self-pay

## 2021-06-10 DIAGNOSIS — R293 Abnormal posture: Secondary | ICD-10-CM | POA: Insufficient documentation

## 2021-06-10 DIAGNOSIS — M6281 Muscle weakness (generalized): Secondary | ICD-10-CM | POA: Insufficient documentation

## 2021-06-10 DIAGNOSIS — M545 Low back pain, unspecified: Secondary | ICD-10-CM | POA: Diagnosis not present

## 2021-06-11 ENCOUNTER — Encounter (HOSPITAL_BASED_OUTPATIENT_CLINIC_OR_DEPARTMENT_OTHER): Payer: Self-pay | Admitting: Physical Therapy

## 2021-06-11 NOTE — Therapy (Signed)
[place], [place], [address] Phone: [phone]   Fax:  [phone]  Physical Therapy Treatment/Discharge Summary  Patient Details  Name: Suzanne Kelley MRN: [id number] Date of Birth: May 02, 1956 Referring Provider (PT): Vivi Barrack, MD   Encounter Date: 06/10/2021   PT End of Session - 06/10/21 1112     Visit Number 11    Number of Visits 22    Date for PT Re-Evaluation 06/10/21    Authorization Type BCBS    PT Start Time 1024    PT Stop Time 1109    PT Time Calculation (min) 45 min    Activity Tolerance Patient tolerated treatment well    Behavior During Therapy [facility] for tasks assessed/performed             Past Medical History:  Diagnosis Date   High cholesterol    History of chicken pox    Hyperlipidemia    Uterine cancer ([place])    UTI (urinary tract infection)     Past Surgical History:  Procedure Laterality Date   ABDOMINAL HYSTERECTOMY     APPENDECTOMY      There were no vitals filed for this visit.   Subjective Assessment - 06/10/21 1027     Subjective Pt reports she was able to ambulate 6,700 steps without adverse effects.  Pt reports she ambulated 8,000 steps on Saturday and had some pain and fatigue the following day.  Pt reports her pain is much better.  She is able to ambulate increased distance without increased pain.  Pt is limited with lifting.  She is not picking up too much groceries at one time.  Pt is splitting up objects in grocery bags.  A lot of times, she has a 0/10 pain. Pt denies any adverse effects after prior Rx. Pt states she felt great after prior Rx.  Pt reports compliance with HEP.    Pertinent History osteopenia in multiple sites.  x-ray indicated compression fx L1 with 40% loss of height anteriorly and trace/grade 1 anterolisthesis at L5-S1.  uterine CA in the 90's    Currently in Pain? Yes    Pain Score 1    1-2/10 worst; 0/10 best pain   Pain  Location --   R sided lumbar               [facility] PT Assessment - 06/11/21 0001       Observation/Other Assessments   Other Surveys  Modified Oswestry    Modified Oswestry 30%                           [facility] Adult PT Treatment/Exercise - 06/11/21 0001       Therapeutic Activites    Therapeutic Activities Other Therapeutic Activities    Other Therapeutic Activities Instructed pt in proper body mechanics and to not bend and lift.  Educated and demonstrated how to properly perform squat to lift and hip hinge.  instructed pt in keeping load close and not twisting.  Pt practiced this technique with cuing picking up a cone from a stool and a timer off a bench.      Neuro Re-ed    Neuro Re-ed Details  tandem gait  without UE assist with occasional UE assist   Also see flow sheet for T band scapular exercises to promote improved postural strength and stability.     Exercises   Exercises Lumbar   Answered  Pt's questions.  Reviewed response to prior Rx, current function, and pain level.  pt completed Oswestry.  Reviewed HEP     Lumbar Exercises: Aerobic   Elliptical with just LEs x 1 min and with UE/LEs 1 - 41mn20 sec      Lumbar Exercises: Standing   Other Standing Lumbar Exercises rows with retraction with BTB and low rows with GTB with retraction 2x10 reps each.  Standing horizontal abd with RTB 2x10 reps                     PT Education - 06/10/21 1056     Education Details POC, dx, and exercise form. Answered Pt's questions.  Instructed pt in proper precautions.  Educated pt concerning hip hinge technique and proper squat to lift.   Reviewed and updated HEP.  Access Code: [email or web address]  URL: [email or web address]  Date: 06/10/2021  Prepared by: TRonny Flurry   Exercises  Seated Scapular Retraction - 2 x daily - 7 x weekly - 1-2 sets - 10 reps  Supine Core Control with Heel Slide - 1-2 x daily - 7 x weekly - 2 sets - 10 reps  Bent Knee Fallouts -  1-2 x daily - 6-7 x weekly - 2 sets - 10 reps  Supine Bridge - 1 x daily - 5 x weekly - 2 sets - 10 reps  Standing Row with Anchored Resistance - 1 x daily - 3 x weekly - 2-3 sets - 10 reps  Hooklying Clamshell with Resistance - 1 x daily - 3 x weekly - 2-3 sets - 10 reps  Shoulder extension with resistance - Neutral - 1 x daily - 3 x weekly - 2-3 sets - 10 reps  Walking Tandem Stance - 1 x daily - 5 x weekly - 2 sets - 10 reps .  Pt is planning to purchase a foam pad.  Educated pt in performing airex marches at home 5x/wk 2-3 sets of 10 reps    Person(s) Educated Patient    Methods Explanation;Demonstration;Verbal cues;Handout    Comprehension Returned demonstration;Verbalized understanding              PT Short Term Goals - 06/10/21 1038       PT SHORT TERM GOAL #4   Title Pt will demo improved posture standing erect with scapulas retracted having reduced forward shoulders for reduced stress and strain on lumbar and to promote postural alignment.    Time 6    Period Weeks    Status Achieved    Target Date 04/29/21               PT Long Term Goals - 06/10/21 1046       PT LONG TERM GOAL #1   Title Pt will demo improved core strength as evidenced by progressing with core exercises without adverse effects and demo 4+ to 5/5 bilat hip flex/abd/ER and knee extension strength for improved performance and tolerance of functional mobility and to assist with returning to PLOF.    Period Weeks    Status Achieved      PT LONG TERM GOAL #2   Title Pt will be able to perform her normal standing activities including household chores without significant pain or limitation.    Time 2    Period Weeks    Status Partially Met    Target Date 06/10/21      PT LONG TERM GOAL #3   Title Pt will be able to perform  her normal community amblulation without increased pain.    Time 8    Period Weeks    Status Achieved      PT LONG TERM GOAL #4   Title Pt will report she is able to perform a  walking program without significant pain.    Status Achieved    Target Date 06/10/21      PT LONG TERM GOAL #5   Title pt will be able to carry her groceries without significant pain or difficulty.    Period Weeks    Status Partially Met    Target Date 06/10/21                   Plan - 06/10/21 1049     Clinical Impression Statement Pt has made excellent progress in PT.  She is doing well and is pleased with her progress.  She continues to increase her walking program and has improved tolerance with walking, daily mobility, and daily activities.  Pt is interested in joining the gym and plans to begin that at the beginning of next year.  PT informed pt to get a release from her MD in order to perform gym workouts.  Pt tried the elliptical today and pt was able to perform without pain though was fatigued.  Pt has limited tolerance on elliptical due to fatigue and fitness level.  She states her back feels better after the elliptical.  Pt continues to have limitaitons with certain household chores including vacuuming and mopping.  She is limited with lifting.  Pt is able to lift light grocery bags though is limited with lifting and carrying grocery bags.  Pt was instructed in proper hip hinge technique/squat to lift technique and demonstrated good form with cuing and repetition.  Pt is compliant with HEP and demonstrates good understanding.  Pt has met all goals except partially met LTG's consisting of household chores and lifting groceries.  Pt is ready for discharge.    Comorbidities osteopenia in multiple sites    PT Treatment/Interventions ADLs/Self Care Home Management;Aquatic Therapy;Cryotherapy;Moist Heat;Electrical Stimulation;Functional mobility training;Therapeutic activities;Therapeutic exercise;Balance training;Neuromuscular re-education;Manual techniques;Patient/family education;Gait training;Dry needling    PT Next Visit Plan Pt to be discharged from skilled PT services due to  meeting the majority of her goals and reaching maximum functional potential.    PT Home Exercise Plan Access Code: [email or web address]    Consulted and Agree with Plan of Care Patient             Patient will benefit from skilled therapeutic intervention in order to improve the following deficits and impairments:  Decreased activity tolerance, Decreased mobility, Decreased endurance, Decreased strength, Difficulty walking, Impaired flexibility, Postural dysfunction  Visit Diagnosis: Low back pain without sciatica, unspecified back pain laterality, unspecified chronicity  Muscle weakness (generalized)  Abnormal posture     Problem List Patient Active Problem List   Diagnosis Date Noted   Closed compression fracture of body of L1 vertebra ([place]) 03/09/2021   Vitamin D deficiency 06/25/2020   Positive colorectal cancer screening using Cologuard test 08/20/2019   Uterine cancer ([place]) 08/01/2019   Hypercholesterolemia 05/19/2018   Osteopenia of multiple sites 05/19/2018    PHYSICAL THERAPY DISCHARGE SUMMARY  Visits from Start of Care: 11  Current functional level related to goals / functional outcomes: See above   Remaining deficits: See above   Education / Equipment: See above   Patient agrees to discharge. Patient has met all goals except partially meeting LTG's of household chores and  lifting groceries.  Pt to be discharged from skilled PT due to meeting the majority of her goals and reaching her maximum functional potential.     Selinda Michaels III PT, DPT 06/11/21 8:28 AM   [place], [place], [address] Phone: [phone]   Fax:  [phone]  Name: CATLYN SHIPTON MRN: [id number] Date of Birth: 1956/06/28

## 2021-06-17 ENCOUNTER — Encounter (HOSPITAL_BASED_OUTPATIENT_CLINIC_OR_DEPARTMENT_OTHER): Payer: BLUE CROSS/BLUE SHIELD | Admitting: Physical Therapy

## 2021-06-19 IMAGING — MG MM DIGITAL SCREENING BILAT W/ TOMO AND CAD
8 series · 8 of 24 positions shown · non-contrast
Comparison: Previous exam(s).

CLINICAL DATA: Screening.

EXAM:
DIGITAL SCREENING BILATERAL MAMMOGRAM WITH TOMOSYNTHESIS AND CAD
TECHNIQUE: Bilateral screening digital craniocaudal and mediolateral oblique
mammograms were obtained. Bilateral screening digital breast
tomosynthesis was performed. The images were evaluated with
computer-aided detection.

[L CC synth-2D]
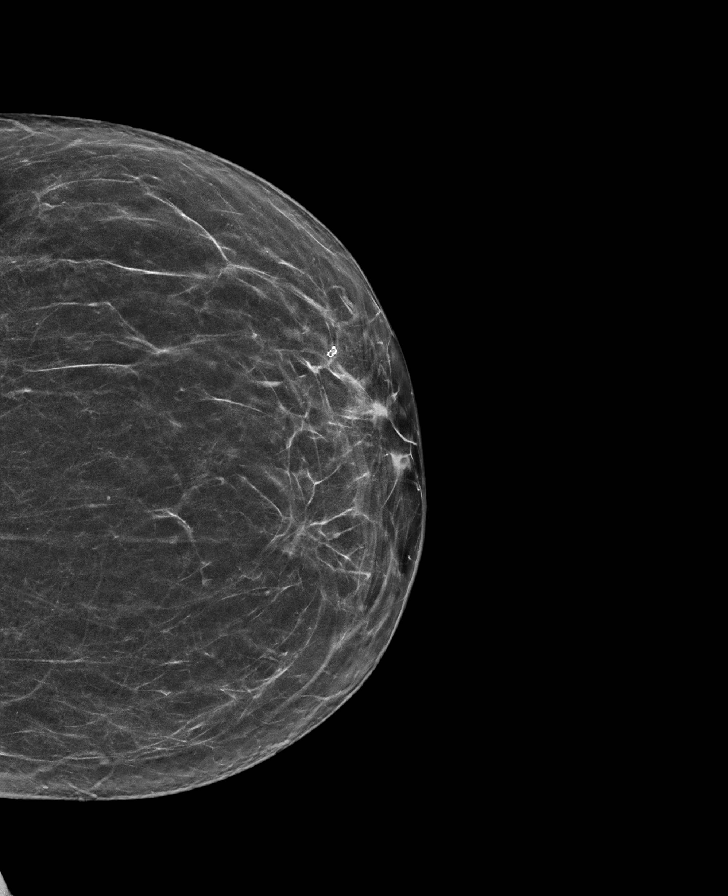

[R CC synth-2D]
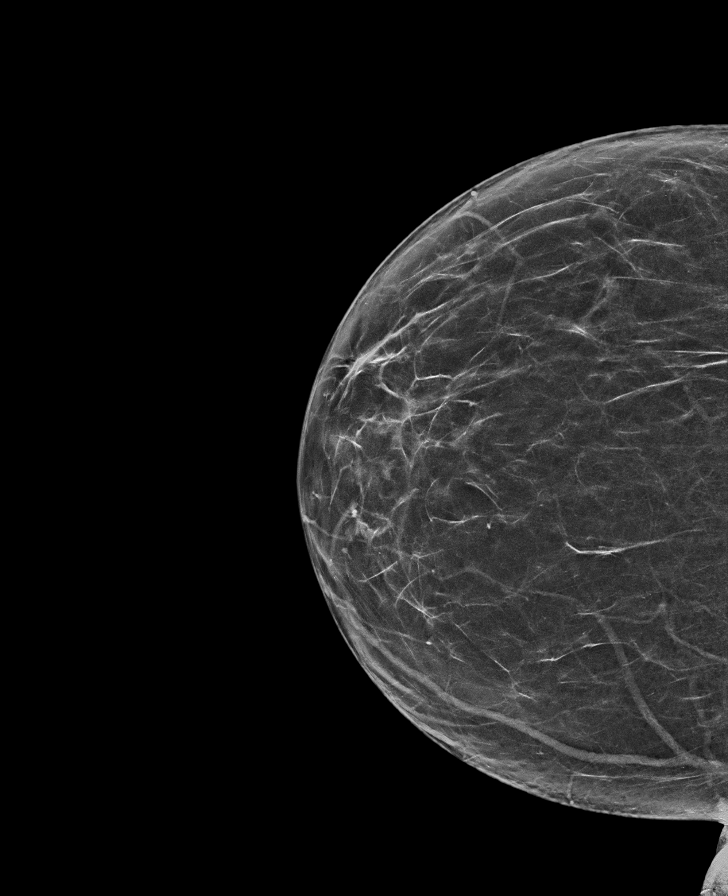

[R MLO synth-2D]
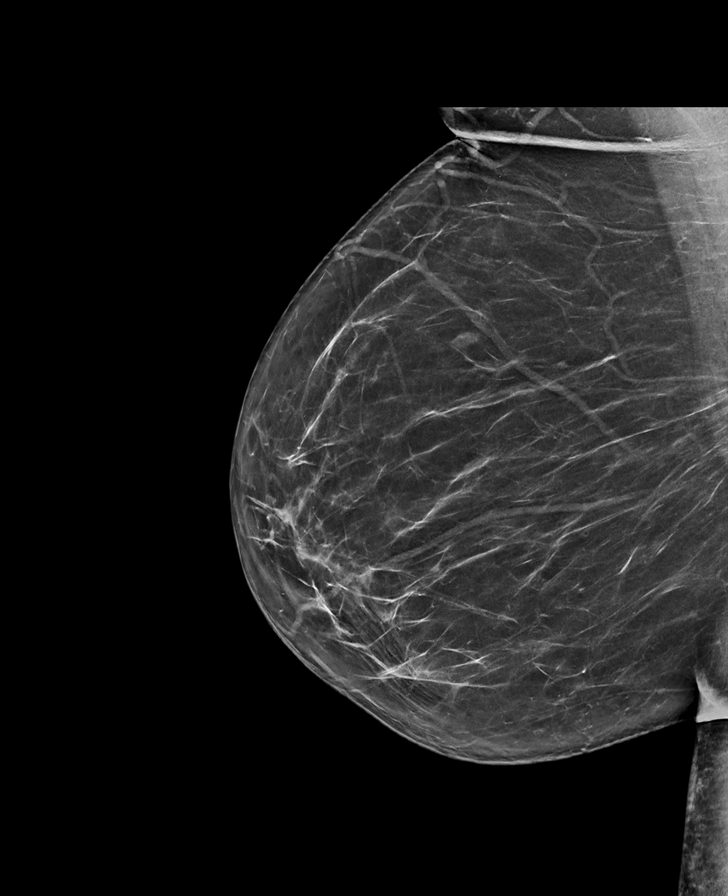

[L MLO synth-2D]
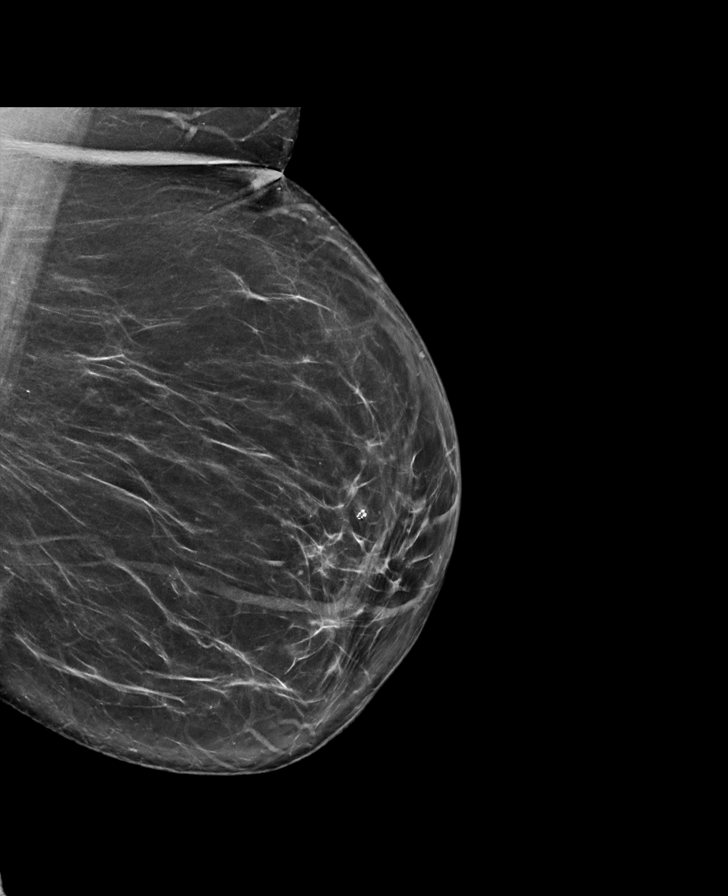

[R MLO tomo · tomo slice 39/78.0]
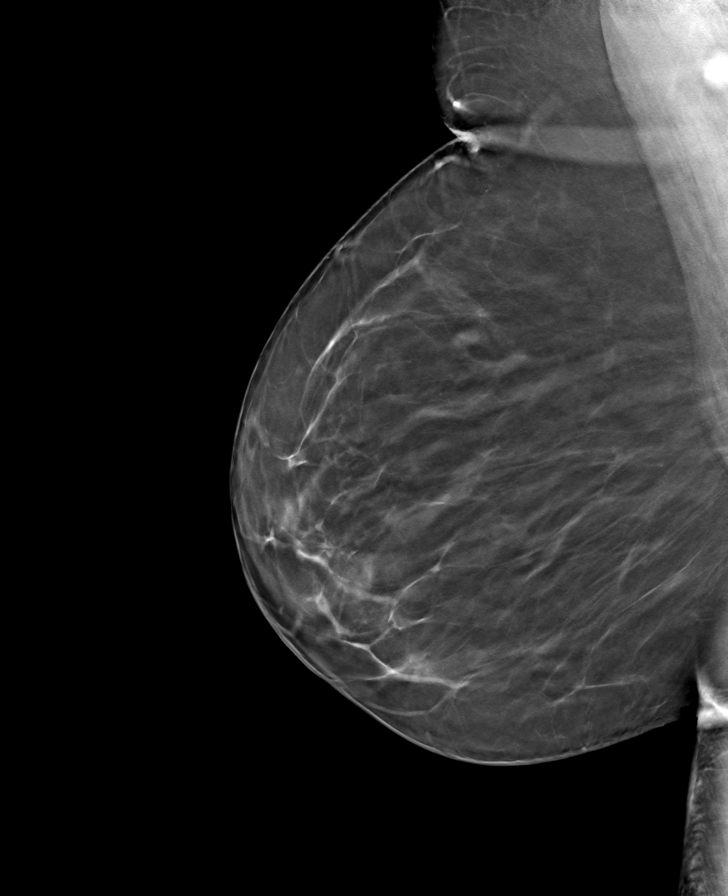

[L CC tomo · tomo slice 33/66.0]
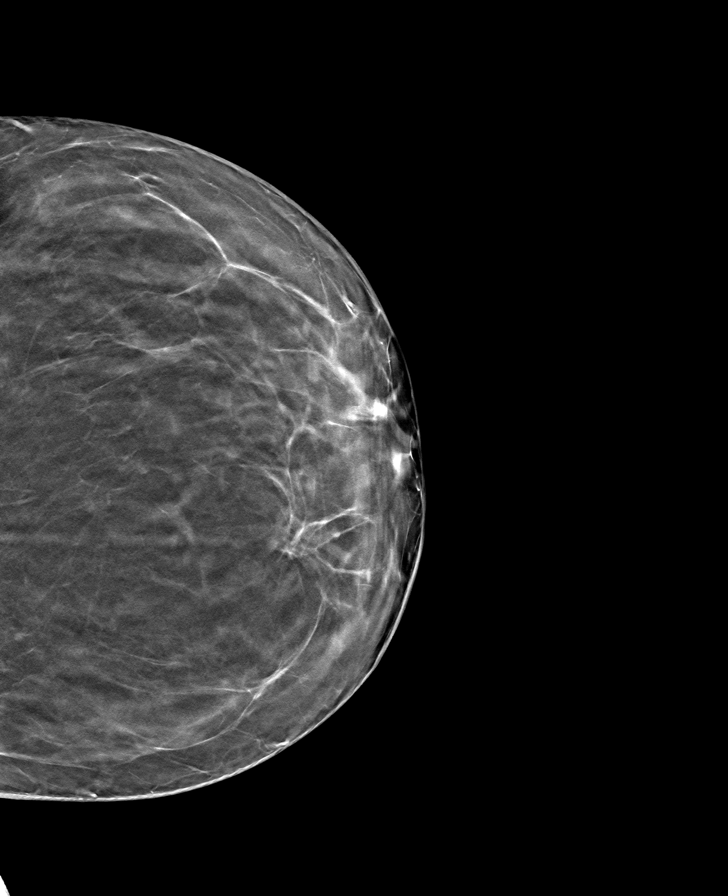

[L MLO tomo · tomo slice 41/82.0]
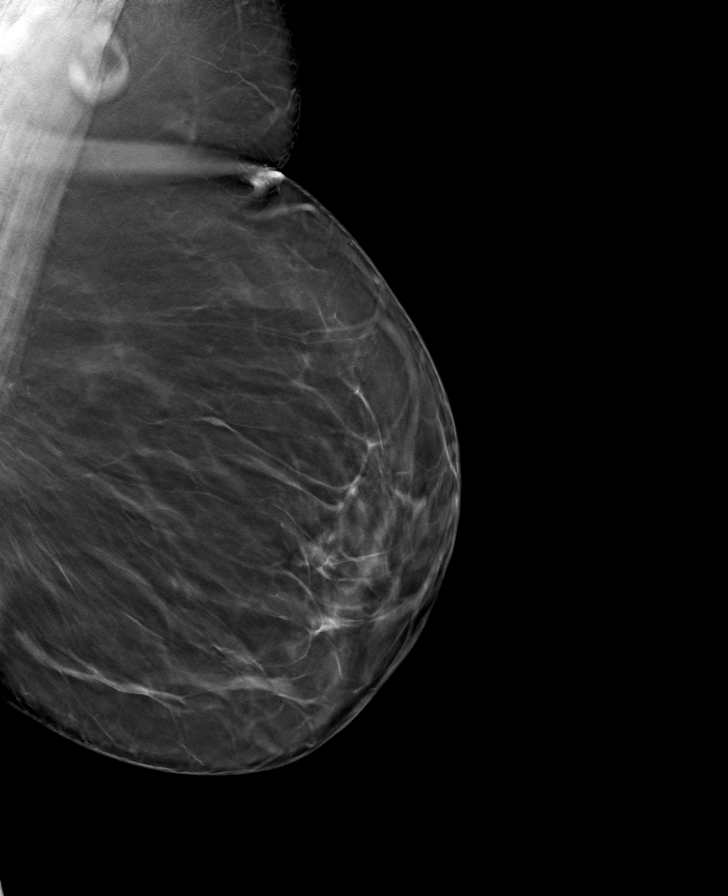

[R CC tomo · tomo slice 35/68.0]
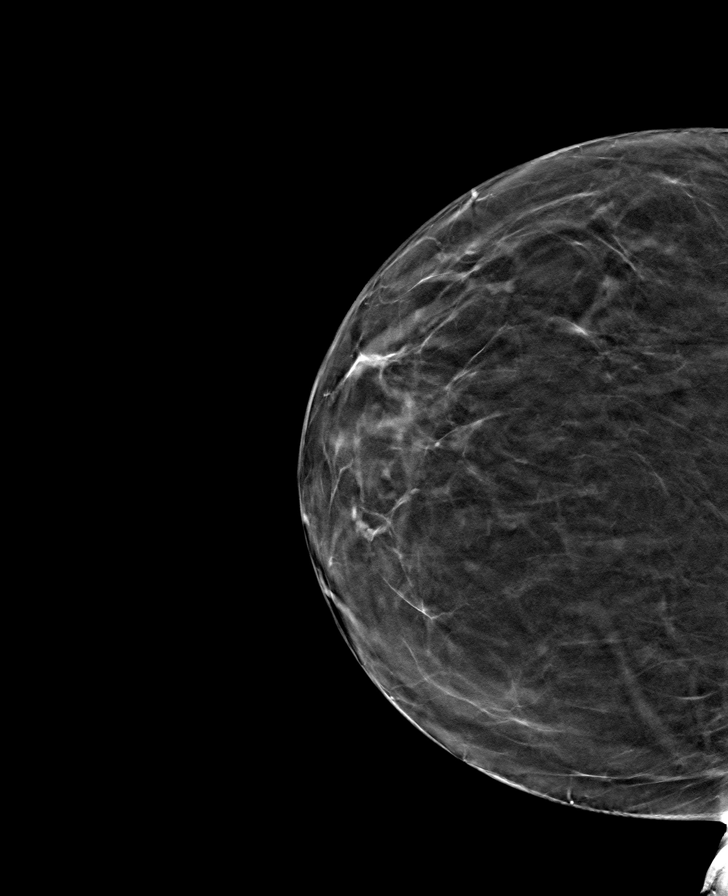

[8 of 24 positions shown; findings below may reference images not displayed]

ACR Breast Density Category b: There are scattered areas of
fibroglandular density.
FINDINGS: There are no findings suspicious for malignancy. The images were
evaluated with computer-aided detection.
IMPRESSION: No mammographic evidence of malignancy. A result letter of this
screening mammogram will be mailed directly to the patient.

RECOMMENDATION:
Screening mammogram in one year. (Code:[id number])

BI-RADS CATEGORY  1: Negative.

## 2021-06-24 ENCOUNTER — Encounter: Payer: BLUE CROSS/BLUE SHIELD | Admitting: Family Medicine

## 2021-06-24 ENCOUNTER — Encounter: Payer: BLUE CROSS/BLUE SHIELD | Admitting: Physician Assistant

## 2021-06-24 ENCOUNTER — Encounter (HOSPITAL_BASED_OUTPATIENT_CLINIC_OR_DEPARTMENT_OTHER): Payer: BLUE CROSS/BLUE SHIELD | Admitting: Physical Therapy

## 2021-07-01 ENCOUNTER — Ambulatory Visit (INDEPENDENT_AMBULATORY_CARE_PROVIDER_SITE_OTHER): Payer: BLUE CROSS/BLUE SHIELD | Admitting: Family Medicine

## 2021-07-01 ENCOUNTER — Encounter: Payer: Self-pay | Admitting: Family Medicine

## 2021-07-01 ENCOUNTER — Encounter: Payer: BLUE CROSS/BLUE SHIELD | Admitting: Family Medicine

## 2021-07-01 ENCOUNTER — Other Ambulatory Visit: Payer: Self-pay

## 2021-07-01 VITALS — BP 140/81 | HR 78 | Temp 98.1°F | Ht 61.0 in | Wt 227.5 lb

## 2021-07-01 DIAGNOSIS — C55 Malignant neoplasm of uterus, part unspecified: Secondary | ICD-10-CM | POA: Diagnosis not present

## 2021-07-01 DIAGNOSIS — S32010A Wedge compression fracture of first lumbar vertebra, initial encounter for closed fracture: Secondary | ICD-10-CM | POA: Diagnosis not present

## 2021-07-01 DIAGNOSIS — Z0001 Encounter for general adult medical examination with abnormal findings: Secondary | ICD-10-CM | POA: Diagnosis not present

## 2021-07-01 DIAGNOSIS — Z131 Encounter for screening for diabetes mellitus: Secondary | ICD-10-CM

## 2021-07-01 DIAGNOSIS — Z6841 Body Mass Index (BMI) 40.0 and over, adult: Secondary | ICD-10-CM

## 2021-07-01 DIAGNOSIS — E7800 Pure hypercholesterolemia, unspecified: Secondary | ICD-10-CM

## 2021-07-01 DIAGNOSIS — E559 Vitamin D deficiency, unspecified: Secondary | ICD-10-CM

## 2021-07-01 DIAGNOSIS — M8589 Other specified disorders of bone density and structure, multiple sites: Secondary | ICD-10-CM

## 2021-07-01 LAB — LIPID PANEL
Cholesterol: 196 mg/dL (ref 0–200)
HDL: 56 mg/dL (ref >39.00)
NonHDL: 139.88
Total CHOL/HDL Ratio: 3
Triglycerides: 280 mg/dL — ABNORMAL HIGH (ref 0.0–149.0)
VLDL: 56 mg/dL — ABNORMAL HIGH (ref 0.0–40.0)

## 2021-07-01 LAB — CBC
HCT: 40.5 % (ref 36.0–46.0)
Hemoglobin: 13.5 g/dL (ref 12.0–15.0)
MCHC: 33.4 g/dL (ref 30.0–36.0)
MCV: 95.3 fl (ref 78.0–100.0)
Platelets: 193 10*3/uL (ref 150.0–400.0)
RBC: 4.25 Mil/uL (ref 3.87–5.11)
RDW: 13 % (ref 11.5–15.5)
WBC: 7.3 10*3/uL (ref 4.0–10.5)

## 2021-07-01 LAB — TSH: TSH: 2.17 u[IU]/mL (ref 0.35–5.50)

## 2021-07-01 LAB — COMPREHENSIVE METABOLIC PANEL
ALT: 14 U/L (ref 0–35)
AST: 23 U/L (ref 0–37)
Albumin: 4.6 g/dL (ref 3.5–5.2)
Alkaline Phosphatase: 47 U/L (ref 39–117)
BUN: 18 mg/dL (ref 6–23)
CO2: 25 mEq/L (ref 19–32)
Calcium: 9.3 mg/dL (ref 8.4–10.5)
Chloride: 103 mEq/L (ref 96–112)
Creatinine, Ser: 0.73 mg/dL (ref 0.40–1.20)
GFR: 85.9 mL/min (ref >60.00)
Glucose, Bld: 95 mg/dL (ref 70–99)
Potassium: 4.4 mEq/L (ref 3.5–5.1)
Sodium: 141 mEq/L (ref 135–145)
Total Bilirubin: 0.9 mg/dL (ref 0.2–1.2)
Total Protein: 7.3 g/dL (ref 6.0–8.3)

## 2021-07-01 LAB — VITAMIN D 25 HYDROXY (VIT D DEFICIENCY, FRACTURES): VITD: 41.15 ng/mL (ref 30.00–100.00)

## 2021-07-01 LAB — HEMOGLOBIN A1C: Hgb A1c MFr Bld: 6 % (ref 4.6–6.5)

## 2021-07-01 LAB — LDL CHOLESTEROL, DIRECT: Direct LDL: 106 mg/dL

## 2021-07-01 MED ORDER — ROSUVASTATIN CALCIUM 10 MG PO TABS: 10.0000 mg | ORAL_TABLET | Freq: Every day | ORAL | 3 refills | Status: AC

## 2021-07-01 NOTE — Assessment & Plan Note (Signed)
She has been doing much better after working with physical therapy.  She did not tolerate Fosamax.  Discussed.  However she will defer for now.  She will continue working with physical therapy and functional trainer.  We can repeat DEXA in 2024.

## 2021-07-01 NOTE — Patient Instructions (Signed)
It was very nice to see you today!  I am glad that you are doing well!  It is okay for you to stay off Fosamax.  We will check blood work today.  We will see you back next year for your annual physical.  Please come back to see Suzanne Kelley sooner if needed.  Take care, Dr Jerline Pain  PLEASE NOTE:  If you had any lab tests please let Suzanne Kelley know if you have not heard back within a few days. You may see your results on mychart before we have a chance to review them but we will give you a call once they are reviewed by Suzanne Kelley. If we ordered any referrals today, please let Suzanne Kelley know if you have not heard from their office within the next week.   Please try these tips to maintain a healthy lifestyle:  Eat at least 3 REAL meals and 1-2 snacks per day.  Aim for no more than 5 hours between eating.  If you eat breakfast, please do so within one hour of getting up.   Each meal should contain half fruits/vegetables, one quarter protein, and one quarter carbs (no bigger than a computer mouse)  Cut down on sweet beverages. This includes juice, soda, and sweet tea.   Drink at least 1 glass of water with each meal and aim for at least 8 glasses per day  Exercise at least 150 minutes every week.    Preventive Care 28 Years and Older, Female Preventive care refers to lifestyle choices and visits with your health care provider that can promote health and wellness. Preventive care visits are also called wellness exams. What can I expect for my preventive care visit? Counseling Your health care provider may ask you questions about your: Medical history, including: Past medical problems. Family medical history. Pregnancy and menstrual history. History of falls. Current health, including: Memory and ability to understand (cognition). Emotional well-being. Home life and relationship well-being. Sexual activity and sexual health. Lifestyle, including: Alcohol, nicotine or tobacco, and drug use. Access to  firearms. Diet, exercise, and sleep habits. Work and work Statistician. Sunscreen use. Safety issues such as seatbelt and bike helmet use. Physical exam Your health care provider will check your: Height and weight. These may be used to calculate your BMI (body mass index). BMI is a measurement that tells if you are at a healthy weight. Waist circumference. This measures the distance around your waistline. This measurement also tells if you are at a healthy weight and may help predict your risk of certain diseases, such as type 2 diabetes and high blood pressure. Heart rate and blood pressure. Body temperature. Skin for abnormal spots. What immunizations do I need? Vaccines are usually given at various ages, according to a schedule. Your health care provider will recommend vaccines for you based on your age, medical history, and lifestyle or other factors, such as travel or where you work. What tests do I need? Screening Your health care provider may recommend screening tests for certain conditions. This may include: Lipid and cholesterol levels. Hepatitis C test. Hepatitis B test. HIV (human immunodeficiency virus) test. STI (sexually transmitted infection) testing, if you are at risk. Lung cancer screening. Colorectal cancer screening. Diabetes screening. This is done by checking your blood sugar (glucose) after you have not eaten for a while (fasting). Mammogram. Talk with your health care provider about how often you should have regular mammograms. BRCA-related cancer screening. This may be done if you have a family history of  breast, ovarian, tubal, or peritoneal cancers. Bone density scan. This is done to screen for osteoporosis. Talk with your health care provider about your test results, treatment options, and if necessary, the need for more tests. Follow these instructions at home: Eating and drinking  Eat a diet that includes fresh fruits and vegetables, whole grains, lean  protein, and low-fat dairy products. Limit your intake of foods with high amounts of sugar, saturated fats, and salt. Take vitamin and mineral supplements as recommended by your health care provider. Do not drink alcohol if your health care provider tells you not to drink. If you drink alcohol: Limit how much you have to 0-1 drink a day. Know how much alcohol is in your drink. In the [place] one drink equals one 12 oz bottle of beer (355 mL), one 5 oz glass of wine (148 mL), or one 1 oz glass of hard liquor (44 mL). Lifestyle Brush your teeth every morning and night with fluoride toothpaste. Floss one time each day. Exercise for at least 30 minutes 5 or more days each week. Do not use any products that contain nicotine or tobacco. These products include cigarettes, chewing tobacco, and vaping devices, such as e-cigarettes. If you need help quitting, ask your health care provider. Do not use drugs. If you are sexually active, practice safe sex. Use a condom or other form of protection in order to prevent STIs. Take aspirin only as told by your health care provider. Make sure that you understand how much to take and what form to take. Work with your health care provider to find out whether it is safe and beneficial for you to take aspirin daily. Ask your health care provider if you need to take a cholesterol-lowering medicine (statin). Find healthy ways to manage stress, such as: Meditation, yoga, or listening to music. Journaling. Talking to a trusted person. Spending time with friends and family. Minimize exposure to UV radiation to reduce your risk of skin cancer. Safety Always wear your seat belt while driving or riding in a vehicle. Do not drive: If you have been drinking alcohol. Do not ride with someone who has been drinking. When you are tired or distracted. While texting. If you have been using any mind-altering substances or drugs. Wear a helmet and other protective equipment during  sports activities. If you have firearms in your house, make sure you follow all gun safety procedures. What's next? Visit your health care provider once a year for an annual wellness visit. Ask your health care provider how often you should have your eyes and teeth checked. Stay up to date on all vaccines. This information is not intended to replace advice given to you by your health care provider. Make sure you discuss any questions you have with your health care provider. Document Revised: 01/21/2021 Document Reviewed: 01/21/2021 Elsevier Patient Education  [place].

## 2021-07-01 NOTE — Progress Notes (Signed)
Chief Complaint:  Suzanne Kelley is a 65 y.o. female who presents today for her annual comprehensive physical exam.    Assessment/Plan:  Chronic Problems Addressed Today: Closed compression fracture of body of L1 vertebra ([place]) She has been doing much better after working with physical therapy.  She did not tolerate Fosamax.  Discussed.  However she will defer for now.  She will continue working with physical therapy and functional trainer.  We can repeat DEXA in 2024.  Vitamin D deficiency Check Vitamin D  Osteopenia of multiple sites Hypercalcemia vitamin D supplementation.  As above she did not tolerate Fosamax.  She would like to defer starting Prolia.  We will repeat DEXA scan in 2 years.  Hypercholesterolemia Doing well on Crestor 10 mg daily.  Check labs today.  Body mass index is 42.99 kg/m. / Obese  BMI Metric Follow Up - 07/01/21 0922       BMI Metric Follow Up-Please document annually   BMI Metric Follow Up Education provided              Preventative Healthcare: Check Labs. Declined vaccines. UTD on cancer screening.   Patient Counseling(The following topics were reviewed and/or handout was given):  -Nutrition: Stressed importance of moderation in sodium/caffeine intake, saturated fat and cholesterol, caloric balance, sufficient intake of fresh fruits, vegetables, and fiber.  -Stressed the importance of regular exercise.   -Substance Abuse: Discussed cessation/primary prevention of tobacco, alcohol, or other drug use; driving or other dangerous activities under the influence; availability of treatment for abuse.   -Injury prevention: Discussed safety belts, safety helmets, smoke detector, smoking near bedding or upholstery.   -Sexuality: Discussed sexually transmitted diseases, partner selection, use of condoms, avoidance of unintended pregnancy and contraceptive alternatives.   -Dental health: Discussed importance of regular tooth brushing, flossing, and  dental visits.  -Health maintenance and immunizations reviewed. Please refer to Health maintenance section.  Return to care in 1 year for next preventative visit.     Subjective:  HPI:  She has no acute complaints today.   The physical therapy has worked very well for her, and is planning on hiring a Insurance underwriter. However, she requires a note from a physician to go to the gym.   She had been experiencing nausea and "swimmy headedness" when taking Fosamax. Because of this she has stopped taking the medication.   Lifestyle Diet: Reasonably heathy diet.  Exercise: Is making efforts to do more exercise.   Depression screen [facility] 2/9 07/01/2021  Decreased Interest 0  Down, Depressed, Hopeless 0  PHQ - 2 Score 0    ROS: Per HPI, otherwise a complete review of systems was negative.   PMH:  The following were reviewed and entered/updated in epic: Past Medical History:  Diagnosis Date   High cholesterol    History of chicken pox    Hyperlipidemia    Uterine cancer ([facility])    UTI (urinary tract infection)    Patient Active Problem List   Diagnosis Date Noted   Closed compression fracture of body of L1 vertebra ([place]) 03/09/2021   Vitamin D deficiency 06/25/2020   Uterine cancer ([place]) 08/01/2019   Hypercholesterolemia 05/19/2018   Osteopenia of multiple sites 05/19/2018   Past Surgical History:  Procedure Laterality Date   ABDOMINAL HYSTERECTOMY     APPENDECTOMY      Family History  Problem Relation Age of Onset   Diabetes Mother    Parkinson's disease Mother    COPD Mother  Kidney failure Father    CAD Brother    Early death Brother    Heart disease Brother    Colon cancer Neg Hx    Rectal cancer Neg Hx    Stomach cancer Neg Hx     Medications- reviewed and updated Current Outpatient Medications  Medication Sig Dispense Refill   Cholecalciferol (VITAMIN D PO) Take 1 tablet by mouth daily.     Multiple Vitamin (THERA) TABS Take 1 tablet by mouth daily.      Multiple Vitamins-Minerals (ALIVE ULTRA POTENCY WOMENS 50+) TABS Take by mouth daily.     rosuvastatin (CRESTOR) 10 MG tablet Take 1 tablet (10 mg total) by mouth daily. 90 tablet 3   No current facility-administered medications for this visit.    Allergies-reviewed and updated No Known Allergies  Social History   Socioeconomic History   Marital status: Married    Spouse name: Not on file   Number of children: 2   Years of education: Not on file   Highest education level: Not on file  Occupational History   Not on file  Tobacco Use   Smoking status: Never   Smokeless tobacco: Never  Vaping Use   Vaping Use: Never used  Substance and Sexual Activity   Alcohol use: Yes    Comment: Rare   Drug use: Never   Sexual activity: Not on file  Other Topics Concern   Not on file  Social History Narrative   Not on file   Social Determinants of Health   Financial Resource Strain: Not on file  Food Insecurity: Not on file  Transportation Needs: Not on file  Physical Activity: Not on file  Stress: Not on file  Social Connections: Not on file        Objective:  Physical Exam: BP 140/81   Pulse 78   Temp 98.1 F (36.7 C) (Temporal)   Ht 5\' 1"  (1.549 m)   Wt 227 lb 8 oz (103.2 kg)   SpO2 100%   BMI 42.99 kg/m   Body mass index is 42.99 kg/m. Wt Readings from Last 3 Encounters:  07/01/21 227 lb 8 oz (103.2 kg)  03/09/21 223 lb (101.2 kg)  12/14/20 215 lb (97.5 kg)   Gen: NAD, resting comfortably HEENT: TMs normal bilaterally. OP clear. No thyromegaly noted.  CV: RRR with no murmurs appreciated Pulm: NWOB, CTAB with no crackles, wheezes, or rhonchi GI: Normal bowel sounds present. Soft, Nontender, Nondistended. MSK: no edema, cyanosis, or clubbing noted Skin: warm, dry Neuro: CN2-12 grossly intact. Strength 5/5 in upper and lower extremities. Reflexes symmetric and intact bilaterally.  Psych: Normal affect and thought content     I,Jordan Kelly,acting as a  scribe for Dimas Chyle, MD.,have documented all relevant documentation on the behalf of Dimas Chyle, MD,as directed by  Dimas Chyle, MD while in the presence of Dimas Chyle, MD.  I, Dimas Chyle, MD, have reviewed all documentation for this visit. The documentation on 07/01/21 for the exam, diagnosis, procedures, and orders are all accurate and complete.  Algis Greenhouse. Jerline Pain, MD 07/01/2021 9:22 AM

## 2021-07-01 NOTE — Assessment & Plan Note (Signed)
Hypercalcemia vitamin D supplementation.  As above she did not tolerate Fosamax.  She would like to defer starting Prolia.  We will repeat DEXA scan in 2 years.

## 2021-07-01 NOTE — Assessment & Plan Note (Signed)
Check Vitamin D.  

## 2021-07-01 NOTE — Assessment & Plan Note (Signed)
Doing well on Crestor 10 mg daily.  Check labs today.

## 2021-07-06 NOTE — Progress Notes (Signed)
Please inform patient of the following:  A1c and cholesterol are borderline but stable. Cholesterol is much better than last year. Do not need to make any changes to her treatment plan at this time. We can recheck in a year.  Algis Greenhouse. Jerline Pain, MD 07/06/2021 12:24 PM

## 2021-08-07 ENCOUNTER — Other Ambulatory Visit: Payer: Self-pay | Admitting: Family Medicine

## 2022-05-03 ENCOUNTER — Encounter: Payer: Self-pay | Admitting: *Deleted

## 2022-07-14 ENCOUNTER — Encounter: Payer: BLUE CROSS/BLUE SHIELD | Admitting: Family Medicine

## 2022-07-22 ENCOUNTER — Encounter: Payer: Self-pay | Admitting: *Deleted
